# Patient Record
Sex: Female | Born: 1965
Health system: Southern US, Community
[De-identification: ages and names within clinical notes are randomized; demographics above are authoritative.]

## PROBLEM LIST (undated history)

## (undated) DIAGNOSIS — I10 Essential (primary) hypertension: Secondary | ICD-10-CM

## (undated) DIAGNOSIS — R011 Cardiac murmur, unspecified: Secondary | ICD-10-CM

## (undated) DIAGNOSIS — M1990 Unspecified osteoarthritis, unspecified site: Secondary | ICD-10-CM

## (undated) HISTORY — DX: Unspecified osteoarthritis, unspecified site: M19.90

## (undated) HISTORY — DX: Cardiac murmur, unspecified: R01.1

## (undated) HISTORY — DX: Essential (primary) hypertension: I10

---

## 1997-10-13 ENCOUNTER — Ambulatory Visit (HOSPITAL_BASED_OUTPATIENT_CLINIC_OR_DEPARTMENT_OTHER): Admission: RE | Admit: 1997-10-13 | Discharge: 1997-10-13 | Payer: Self-pay | Admitting: Orthopedic Surgery

## 1998-07-15 ENCOUNTER — Encounter: Payer: Self-pay | Admitting: Emergency Medicine

## 1998-07-15 ENCOUNTER — Emergency Department (HOSPITAL_COMMUNITY): Admission: EM | Admit: 1998-07-15 | Discharge: 1998-07-15 | Payer: Self-pay | Admitting: Emergency Medicine

## 1998-07-16 ENCOUNTER — Encounter: Payer: Self-pay | Admitting: Emergency Medicine

## 1998-07-16 ENCOUNTER — Ambulatory Visit (HOSPITAL_COMMUNITY): Admission: RE | Admit: 1998-07-16 | Discharge: 1998-07-16 | Payer: Self-pay | Admitting: *Deleted

## 2000-01-24 ENCOUNTER — Encounter: Payer: Self-pay | Admitting: Internal Medicine

## 2000-01-24 ENCOUNTER — Encounter: Admission: RE | Admit: 2000-01-24 | Discharge: 2000-01-24 | Payer: Self-pay | Admitting: Internal Medicine

## 2003-05-03 HISTORY — PX: CHOLECYSTECTOMY: SHX55

## 2003-09-02 ENCOUNTER — Observation Stay (HOSPITAL_COMMUNITY): Admission: RE | Admit: 2003-09-02 | Discharge: 2003-09-03 | Payer: Self-pay | Admitting: General Surgery

## 2003-09-02 ENCOUNTER — Encounter (INDEPENDENT_AMBULATORY_CARE_PROVIDER_SITE_OTHER): Payer: Self-pay | Admitting: Specialist

## 2007-02-19 ENCOUNTER — Emergency Department (HOSPITAL_COMMUNITY): Admission: EM | Admit: 2007-02-19 | Discharge: 2007-02-19 | Payer: Self-pay | Admitting: Emergency Medicine

## 2008-05-02 HISTORY — PX: DILATION AND CURETTAGE OF UTERUS: SHX78

## 2009-02-25 ENCOUNTER — Ambulatory Visit (HOSPITAL_COMMUNITY): Admission: RE | Admit: 2009-02-25 | Discharge: 2009-02-25 | Payer: Self-pay | Admitting: Obstetrics and Gynecology

## 2009-02-25 ENCOUNTER — Encounter (INDEPENDENT_AMBULATORY_CARE_PROVIDER_SITE_OTHER): Payer: Self-pay | Admitting: Obstetrics and Gynecology

## 2009-02-25 ENCOUNTER — Inpatient Hospital Stay (HOSPITAL_COMMUNITY): Admission: AD | Admit: 2009-02-25 | Discharge: 2009-02-25 | Payer: Self-pay | Admitting: Obstetrics and Gynecology

## 2010-08-05 LAB — CBC
HCT: 33.7 % — ABNORMAL LOW (ref 36.0–46.0)
Hemoglobin: 11.2 g/dL — ABNORMAL LOW (ref 12.0–15.0)
MCHC: 33.1 g/dL (ref 30.0–36.0)
MCV: 91.4 fL (ref 78.0–100.0)
Platelets: 268 10*3/uL (ref 150–400)
RBC: 3.69 MIL/uL — ABNORMAL LOW (ref 3.87–5.11)
RDW: 18.3 % — ABNORMAL HIGH (ref 11.5–15.5)
WBC: 8.5 10*3/uL (ref 4.0–10.5)

## 2010-08-05 LAB — PREGNANCY, URINE: Preg Test, Ur: NEGATIVE

## 2010-09-17 NOTE — Op Note (Signed)
Kristin Alexander, Kristin Alexander                         ACCOUNT NO.:  [id number]   MEDICAL RECORD NO.:  [id number]                   PATIENT TYPE:  AMB   LOCATION:  DAY                                  FACILITY:  [facility]   PHYSICIAN:  Gita Kudo, M.D.              DATE OF BIRTH:  1965-06-06   DATE OF PROCEDURE:  DATE OF DISCHARGE:                                 OPERATIVE REPORT   OPERATION PERFORMED:  Laparoscopic cholecystectomy.   SURGEON:  Gita Kudo, M.D.   ASSISTANT:  Lorne Skeens. Hoxworth, M.D.   ANESTHESIA:  General endotracheal anesthesia.   PREOPERATIVE DIAGNOSIS:  Gallstones.   POSTOPERATIVE DIAGNOSIS:  Gallstones, subacutely inflamed with adhesions, a  tiny cystic duct, unable to be cannulated.   CLINICAL SUMMARY:  A 45 year old female seen with acute abdominal pain.  Several episodes and worsening recently.  Comes in for elective surgery.   Her gallbladder ultrasound shows stones, and her liver function studies are  normal.   FINDINGS:  There are multiple adhesions to the gallbladder, which itself was  thickened and inflamed.  The cystic duct was tiny, and I could not cannulate  it to do a cholangiogram.   OPERATION/PROCEDURE:  Under satisfactory general endotracheal anesthesia,  having received 1.0 gm Ancef preop, the patient's abdomen was prepped and  draped in a standard fashion.  A total of 30 cc of 0.5% Marcaine was  infiltrated at the four skin incision sites before making that.  A  transverse incision made above the umbilicus and carried down to the  midline.  The patient's was quite deep, but we visualized the midline and  opened it into the peritoneum.  This was controlled with a figure-of-eight 0  Vicryl suture.  An Hasson operating port was inserted and secured.  Good CO2  pneumoperitoneum established and camera placed.  Under direct vision, two #5  ports placed laterally and a second #10 medially.  Then with graspers in the  lateral port giving  good exposure, operating through the medial port, I took  down the adhesions to the gallbladder with care, using coagulating scissors  for dissection.  Then the cystic duct and artery were each circumferentially  dissected.  When certain of the anatomy, multiple clips placed on the artery  and a single clip on the duct near the gallbladder.  An incision was made in  the duct, and it was so tiny that I thought that it would be impossible to  cannulate it, and after several attempts, could not do so.  Then the duct  was controlled with three clips and divided.  The gallbladder then removed  from below upward using a coagulating current for dissection and hemostasis.  The liver bed was dry.  A small hole was made in the gallbladder, and it was  then suctioned free of contents.  There was no gross spillage of stones.  The gallbladder was then placed  in an EndoCatch bag and secured.  The  abdomen was lavaged with a remainder of that liter of saline and suctioned  dry.  Then the camera was moved to the upper port and a large grasper used  to extract the gallbladder, in the bag intact without spillage or problem  through the umbilicus.  A second liter was again used to irrigate the upper  abdomen, and these returns were clear.  Then the CO2 was released.  The  ports removed under vision.  The midline was close with the previous suture  as well as a second interrupted #0 Vicryl suture.  Then the upper medial  port site was extended somewhat, and its fascial defect closed with a single  figure-of-eight 0 Vicryl.  The subcu was lavaged with saline, approximately  with 4-0 Vicryl, and the skin approximated with 4-0 Vicryl and Steri-Strips.  Sterile dressings were applied, and the patient went to the recovery room  from the operating room in good condition without complication.                                               Gita Kudo, M.D.    MRL/MEDQ  D:  09/02/2003  T:  09/02/2003  Job:   [id number]   cc:   Aida Puffer, MD  [facility]  [address]  [place], [place] [address]

## 2012-04-03 ENCOUNTER — Other Ambulatory Visit: Payer: Self-pay | Admitting: Obstetrics and Gynecology

## 2013-04-09 ENCOUNTER — Other Ambulatory Visit: Payer: Self-pay | Admitting: Obstetrics and Gynecology

## 2013-04-17 ENCOUNTER — Other Ambulatory Visit: Payer: Self-pay | Admitting: Obstetrics and Gynecology

## 2013-04-17 DIAGNOSIS — R928 Other abnormal and inconclusive findings on diagnostic imaging of breast: Secondary | ICD-10-CM

## 2013-04-26 ENCOUNTER — Ambulatory Visit
Admission: RE | Admit: 2013-04-26 | Discharge: 2013-04-26 | Disposition: A | Discharge status: Home / Self Care | Payer: No Typology Code available for payment source | Source: Ambulatory Visit | Attending: Obstetrics and Gynecology | Admitting: Obstetrics and Gynecology

## 2013-04-26 DIAGNOSIS — R928 Other abnormal and inconclusive findings on diagnostic imaging of breast: Secondary | ICD-10-CM

## 2014-08-07 ENCOUNTER — Other Ambulatory Visit: Payer: Self-pay | Admitting: Obstetrics and Gynecology

## 2014-08-08 LAB — CYTOLOGY - PAP

## 2015-08-13 ENCOUNTER — Encounter: Payer: Self-pay | Admitting: Physical Medicine & Rehabilitation

## 2015-08-13 ENCOUNTER — Encounter
Payer: BLUE CROSS/BLUE SHIELD | Attending: Physical Medicine & Rehabilitation | Admitting: Physical Medicine & Rehabilitation

## 2015-08-13 VITALS — BP 155/95 | HR 90 | Resp 14

## 2015-08-13 DIAGNOSIS — R269 Unspecified abnormalities of gait and mobility: Secondary | ICD-10-CM

## 2015-08-13 DIAGNOSIS — M1990 Unspecified osteoarthritis, unspecified site: Secondary | ICD-10-CM

## 2015-08-13 DIAGNOSIS — G894 Chronic pain syndrome: Secondary | ICD-10-CM | POA: Diagnosis not present

## 2015-08-13 DIAGNOSIS — Z9049 Acquired absence of other specified parts of digestive tract: Secondary | ICD-10-CM | POA: Diagnosis not present

## 2015-08-13 DIAGNOSIS — M170 Bilateral primary osteoarthritis of knee: Secondary | ICD-10-CM

## 2015-08-13 DIAGNOSIS — M25561 Pain in right knee: Secondary | ICD-10-CM | POA: Diagnosis present

## 2015-08-13 DIAGNOSIS — M25562 Pain in left knee: Secondary | ICD-10-CM | POA: Diagnosis present

## 2015-08-13 DIAGNOSIS — Z0000 Encounter for general adult medical examination without abnormal findings: Secondary | ICD-10-CM

## 2015-08-13 DIAGNOSIS — Z79899 Other long term (current) drug therapy: Secondary | ICD-10-CM | POA: Diagnosis not present

## 2015-08-13 DIAGNOSIS — Z5181 Encounter for therapeutic drug level monitoring: Secondary | ICD-10-CM | POA: Diagnosis not present

## 2015-08-13 HISTORY — DX: Unspecified osteoarthritis, unspecified site: M19.90

## 2015-08-13 MED ORDER — TRAMADOL HCL 50 MG PO TABS: 50.0000 mg | ORAL_TABLET | Freq: Three times a day (TID) | ORAL | Status: DC | PRN

## 2015-08-13 NOTE — Progress Notes (Signed)
Subjective:    Patient ID: Kristin Alexander, female    DOB: 04/05/1966, 50 y.o.   MRN: [id number]  HPI  50 y/o female with pmh of arthritis in knees and hands, morbid obesity presents for R>L knee pain.  The pain started ~2000, she denies trauma.  It has been getting progressively worse since January 2017.  She had cortisone and hyaluronic acid injections, which helped on the left knee, but not the right.  She is also on meloxicam (for 2 months), which is helping a little bit.  Heat improves the pain.  Cold, walking, standing, exacerbate the pain.  Denies associated numbness/tingling weakness.  Sharp, stabbing, achy pain.  It can radiate to her foot.  She has occasional back pain.  Knee pain is constant.  10/10 severity today.  The plan is for her to have a TKR, but is not a good surgical candidate at present.   She states she had xrays performed (not available for review), which showed lateral OA.  She is using a brace for comfort.   She ambulates with a cane.  She has not seen a PCP due to financial restrictions for years, but is planning on seeing one next week.   Pain Inventory Average Pain 10 Pain Right Now 10 My pain is sharp, burning, stabbing and aching  In the last 24 hours, has pain interfered with the following? General activity 10 Relation with others 10 Enjoyment of life 10 What TIME of day is your pain at its worst? morning, daytime, evening, night Sleep (in general) Fair  Pain is worse with: walking, sitting and standing Pain improves with: rest and heat/ice Relief from Meds: NA  Mobility walk with assistance use a cane how many minutes can you walk? 7 steps  do you drive?  yes  Function employed # of hrs/week 40  Neuro/Psych trouble walking  Prior Studies Any changes since last visit?  no  Physicians involved in your care Orthopedist Dr. Eulah PontMurphy   Family History  Problem Relation Age of Onset  . Hypertension Mother   . Diabetes Mother   . COPD Mother     . Heart disease Mother   . Diabetes Father   . Stroke Father   . Heart disease Father   . Heart attack Sister   . Lupus Sister    Social History   Social History  . Marital Status: Single    Spouse Name: N/A  . Number of Children: N/A  . Years of Education: N/A   Social History Main Topics  . Smoking status: Never Smoker   . Smokeless tobacco: None  . Alcohol Use: None  . Drug Use: None  . Sexual Activity: Not Asked   Other Topics Concern  . None   Social History Narrative  . None   Past Surgical History  Procedure Laterality Date  . Cholecystectomy  2005  . Dilation and curettage of uterus  2010   Past Medical History  Diagnosis Date  . Arthritis    BP 155/95 mmHg  Pulse 90  Resp 14  SpO2 98%  LMP  (Within Months)  Opioid Risk Score:   Fall Risk Score:  `1  Depression screen PHQ 2/9  Depression screen PHQ 2/9 08/13/2015  Decreased Interest 3  Down, Depressed, Hopeless 2  PHQ - 2 Score 5  Altered sleeping 2  Tired, decreased energy 3  Change in appetite 3  Feeling bad or failure about yourself  2  Trouble concentrating 1  Moving slowly or fidgety/restless 3  Suicidal thoughts 1  PHQ-9 Score 20  Difficult doing work/chores Very difficult    Current outpatient prescriptions:  .  acetaminophen (TYLENOL) 325 MG tablet, Take 650 mg by mouth every 6 (six) hours as needed., Disp: , Rfl:  .  meloxicam (MOBIC) 15 MG tablet, Take 15 mg by mouth daily., Disp: , Rfl:  .  norethindrone-ethinyl estradiol (JUNEL FE,GILDESS FE,LOESTRIN FE) 1-20 MG-MCG tablet, Take 1 tablet by mouth daily., Disp: , Rfl:  .  omeprazole (PRILOSEC) 20 MG capsule, Take 20 mg by mouth daily., Disp: , Rfl:    Review of Systems  Cardiovascular:       Limb swelling   Musculoskeletal: Positive for gait problem.  All other systems reviewed and are negative.     Objective:   Physical Exam HENT: Normocephalic, Atraumatic Eyes: EOMI, Conj WNL Cardio: S1, S2 normal, RRR Pulm: B/l  clear to auscultation.  Effort normal Abd: Obese. Soft, non-distended, non-tender, BS+ MSK:  Gait Antalgic.   TTP lateral >medial knee.   Negative anterior/posterior drawer sign  Valgus deformity b/l knees  Neuro:  Sensation intact to light touch in all LE dermatomes  Reflexes 1+ throughout (limited by pain and body habitus)  Strength  4/5 in all RLE myotimes (pain inhibition)    5/5 in all LLE myotomes Skin: Warm and Dry    Assessment & Plan:  50 y/o female with pmh of arthritis in knees and hands, morbid obesity presents for R>L knee pain.  1. OA b/l knees  She states xrays revealed lateral compartment OA b/l  Left knee improved with injections  Right knee end stage OA, not a surgical candidate due to BMI  She does not have access to a pool  Referral for PT for RLE exercises and quad strengthening, and TENS unit  Cont Meloxicam per Ortho  Will refer to orthotist for knee off-loading brace and/or heel wedge  Will consider Voltaren gel in future  Will order tramadol 50 q8 PRN  2. Abnormality of gait  No falls  Cont cane for safety  3. Morbid obesity  Referral to PT  Encouraged diet compliance and weight loss  4. General health  Follow up with PCP regarding lab work, especially Cr, being on Meloxicam  5. Chronic pain syndrome  See above  RTC 6 weeks.

## 2015-08-19 LAB — TOXASSURE SELECT,+ANTIDEPR,UR: PDF: 0

## 2015-08-21 NOTE — Progress Notes (Signed)
Urine drug screen for this encounter is consistent for having no prescribed narcotic medication

## 2015-09-23 ENCOUNTER — Ambulatory Visit: Payer: BLUE CROSS/BLUE SHIELD | Admitting: Physical Medicine & Rehabilitation

## 2015-09-23 ENCOUNTER — Telehealth: Payer: Self-pay | Admitting: *Deleted

## 2015-09-23 NOTE — Telephone Encounter (Signed)
Pt's pharmacy called asking for tramadol refill on behalf of patient.  Patient has upcoming appointment on 05/31. Last Rx for this medication was written 08/13/2015 during pt's last visit (tramadol 50 mg #120, sig: take 1 tablet by mouth every 8 hours).  Patient was told to schedule next clinic visit 6 weeks out, hence the 05/31 appt date.  Please advise on refill

## 2015-09-24 ENCOUNTER — Other Ambulatory Visit: Payer: Self-pay | Admitting: *Deleted

## 2015-09-24 MED ORDER — TRAMADOL HCL 50 MG PO TABS: 50.0000 mg | ORAL_TABLET | Freq: Three times a day (TID) | ORAL | Status: DC | PRN

## 2015-09-25 ENCOUNTER — Ambulatory Visit: Payer: No Typology Code available for payment source | Admitting: Physical Medicine & Rehabilitation

## 2015-09-25 ENCOUNTER — Other Ambulatory Visit: Payer: Self-pay

## 2015-09-25 MED ORDER — TRAMADOL HCL 50 MG PO TABS: 50.0000 mg | ORAL_TABLET | Freq: Three times a day (TID) | ORAL | Status: DC | PRN

## 2015-09-25 NOTE — Telephone Encounter (Signed)
Pt states that the Tramadol was sent to the wrong pharmacy. Tramadol [facility] called into [place].

## 2015-09-30 ENCOUNTER — Encounter: Payer: Self-pay | Admitting: Physical Medicine & Rehabilitation

## 2015-09-30 ENCOUNTER — Encounter
Payer: BLUE CROSS/BLUE SHIELD | Attending: Physical Medicine & Rehabilitation | Admitting: Physical Medicine & Rehabilitation

## 2015-09-30 VITALS — BP 165/94 | HR 72 | Resp 16

## 2015-09-30 DIAGNOSIS — I10 Essential (primary) hypertension: Secondary | ICD-10-CM

## 2015-09-30 DIAGNOSIS — Z9049 Acquired absence of other specified parts of digestive tract: Secondary | ICD-10-CM | POA: Insufficient documentation

## 2015-09-30 DIAGNOSIS — M170 Bilateral primary osteoarthritis of knee: Secondary | ICD-10-CM

## 2015-09-30 DIAGNOSIS — R269 Unspecified abnormalities of gait and mobility: Secondary | ICD-10-CM | POA: Diagnosis not present

## 2015-09-30 DIAGNOSIS — G894 Chronic pain syndrome: Secondary | ICD-10-CM | POA: Diagnosis not present

## 2015-09-30 DIAGNOSIS — M25561 Pain in right knee: Secondary | ICD-10-CM | POA: Diagnosis present

## 2015-09-30 DIAGNOSIS — M25562 Pain in left knee: Secondary | ICD-10-CM | POA: Diagnosis present

## 2015-09-30 MED ORDER — TRAMADOL HCL 50 MG PO TABS: 50.0000 mg | ORAL_TABLET | Freq: Three times a day (TID) | ORAL | Status: DC | PRN

## 2015-09-30 MED ORDER — DICLOFENAC SODIUM 1 % TD GEL: 2.0000 g | Freq: Four times a day (QID) | TRANSDERMAL | Status: DC

## 2015-09-30 NOTE — Progress Notes (Signed)
Subjective:    Patient ID: Kristin Alexander, female    DOB: Aug 04, 1965, 50 y.o.   MRN: [id number]  HPI  50 y/o female with pmh of arthritis in knees and hands, morbid obesity presents for follow up of R>L knee pain.  The pain started ~2000, she denies trauma. She had cortisone and hyaluronic acid injections, which helped on the left knee, but not the right.   Heat improves the pain.  Cold, walking, standing, exacerbate the pain.  Denies associated numbness/tingling weakness.  Sharp, stabbing, achy pain.  It can radiate to her foot.  She has occasional back pain.  Knee pain is constant.  The plan is for her to have a TKR, but is not a good surgical candidate at present.  She states she had xrays performed (not available for review), which showed lateral OA.    Last clinic visit 08/13/15. She ambulates with a cane for community ambulation and at night.  Pain is getting better since last visit.  She continues to take meloxicam, which is helping. 5/10 severity today.  She is using a brace for comfort.  Since last visit, she was able to see a PCP.   On last visit, she was referred to PT, which has been "wonderful".  Her ROM has significantly improved.  The tramadol has significantly improved, she was taking q8, now BID.  Her gait and endurance has significantly improved.  She has not tried TENS unit. She has not received an off-loading brace.  Denies falls. She continues to try to lose weight.   Pt continues to work.   Pain Inventory Average Pain 5 Pain Right Now 5 My pain is dull and aching  In the last 24 hours, has pain interfered with the following? General activity 5 Relation with others 5 Enjoyment of life 5 What TIME of day is your pain at its worst? morning Sleep (in general) Fair  Pain is worse with: walking and standing Pain improves with: heat/ice and therapy/exercise Relief from Meds: NA  Mobility use a cane how many minutes can you walk? 5-8 min do you drive?   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  no  Physicians involved in your care Orthopedist Dr. Eulah Pont   Family History  Problem Relation Age of Onset  . Hypertension Mother   . Diabetes Mother   . COPD Mother   . Heart disease Mother   . Diabetes Father   . Stroke Father   . Heart disease Father   . Heart attack Sister   . Lupus Sister    Social History   Social History  . Marital Status: Single    Spouse Name: N/A  . Number of Children: N/A  . Years of Education: N/A   Social History Main Topics  . Smoking status: Never Smoker   . Smokeless tobacco: None  . Alcohol Use: None  . Drug Use: None  . Sexual Activity: Not Asked   Other Topics Concern  . None   Social History Narrative   Past Surgical History  Procedure Laterality Date  . Cholecystectomy  2005  . Dilation and curettage of uterus  2010   Past Medical History  Diagnosis Date  . Arthritis   . OA (osteoarthritis) 08/13/2015   BP 165/94 mmHg  Pulse 72  Resp 16  SpO2 100%  Opioid Risk Score:   Fall Risk Score:  `  1  Depression screen PHQ 2/9  [facility] 09/30/2015 08/13/2015  Decreased Interest 0 3  Down, Depressed, Hopeless 0 2  PHQ - 2 Score 0 5  Altered sleeping - 2  Tired, decreased energy - 3  Change in appetite - 3  Feeling bad or failure about yourself  - 2  Trouble concentrating - 1  Moving slowly or fidgety/restless - 3  Suicidal thoughts - 1  PHQ-9 Score - 20  Difficult doing work/chores - Very difficult    Current outpatient prescriptions:  .  acetaminophen (TYLENOL) 325 MG tablet, Take 650 mg by mouth every 6 (six) hours as needed., Disp: , Rfl:  .  meloxicam (MOBIC) 15 MG tablet, Take 15 mg by mouth daily., Disp: , Rfl:  .  norethindrone-ethinyl estradiol (JUNEL FE,GILDESS FE,LOESTRIN FE) 1-20 MG-MCG tablet, Take 1 tablet by mouth daily., Disp: ,  Rfl:  .  omeprazole (PRILOSEC) 20 MG capsule, Take 20 mg by mouth daily., Disp: , Rfl:  .  traMADol (ULTRAM) 50 MG tablet, Take 1 tablet (50 mg total) by mouth every 8 (eight) hours as needed., Disp: 15 tablet, Rfl: 0   Review of Systems  Cardiovascular:       Limb swelling   Musculoskeletal: Positive for arthralgias and gait problem. Negative for myalgias and back pain.  Neurological: Negative for weakness and numbness.  All other systems reviewed and are negative.     Objective:   Physical Exam HENT: Normocephalic, Atraumatic Eyes: EOMI, Conj WNL Cardio: S1, S2 normal, RRR Pulm: B/l clear to auscultation.  Effort normal Abd: Obese. Soft, non-distended, non-tender, BS+ MSK:  Gait Antalgic.   No TTP bilateral knees.   Negative anterior/posterior drawer sign  Valgus deformity b/l knees  Neuro:  Sensation intact to light touch in all LE dermatomes  Reflexes 1+ throughout (limited by pain and body habitus)  Strength  4+/5 in all RLE myotimes (pain inhibition)    5/5 in all LLE myotomes Skin: Warm and Dry    Assessment & Plan:  50 y/o female with pmh of arthritis in knees and hands, morbid obesity presents for follow up of R>L knee pain.  1. OA b/l knees  She states xrays revealed lateral compartment OA b/l  Left knee improved with injections  Right knee end stage OA, not a surgical candidate due to BMI  She does not have access to a pool  Cont Meloxicam per Ortho  Will follow up on referral to orthotist for knee off-loading brace  Cont PT for RLE exercises and quad strengthening, and TENS unit  Will order Voltaren gel  Cont tramadol 50 q8 PRN (pt has reduced dose, sometimes only needing it BID at present)  2. Abnormality of gait  No falls  Cont cane for safety  3. Morbid obesity  Cont PT  Encouraged diet compliance and weight loss  4. General health  Follow up with PCP for lab work, especially Cr, being on Meloxicam and BP  5. Chronic pain syndrome  See above  6.  HTN  See #4  RTC 6 weeks.

## 2015-09-30 NOTE — Patient Instructions (Addendum)
Follow up on pool therapy programs  Follow up with PCP regarding routine labs, please bring to next visit  Follow up with PT regarding TENs unit  Follow up with Orthotist for offloading brace

## 2015-09-30 NOTE — Addendum Note (Signed)
Addended by: Maryla MorrowPATEL, Darcelle Herrada A on: 09/30/2015 10:14 AM   Modules accepted: Orders

## 2015-10-27 LAB — BASIC METABOLIC PANEL
Creatinine: 0.6 (ref 0.5–1.1)
Glucose: 85
Potassium: 4.1 (ref 3.4–5.3)
SODIUM: 139 (ref 137–147)

## 2015-10-27 LAB — LIPID PANEL
CHOLESTEROL: 207 — AB (ref 0–200)
HDL: 46 (ref 35–70)
LDL CALC: 131
Triglycerides: 149 (ref 40–160)

## 2015-10-27 LAB — CBC AND DIFFERENTIAL
HCT: 41 (ref 36–46)
HEMOGLOBIN: 13.4 (ref 12.0–16.0)
Platelets: 266 (ref 150–399)
WBC: 10.7

## 2015-10-27 LAB — HEPATIC FUNCTION PANEL
ALT: 13 (ref 7–35)
AST: 19 (ref 13–35)
Alkaline Phosphatase: 87 (ref 25–125)
BILIRUBIN, TOTAL: 0.3

## 2015-10-27 LAB — TSH: TSH: 3.34 (ref 0.41–5.90)

## 2015-11-11 ENCOUNTER — Encounter: Payer: BLUE CROSS/BLUE SHIELD | Admitting: Physical Medicine & Rehabilitation

## 2015-11-12 ENCOUNTER — Encounter
Payer: BLUE CROSS/BLUE SHIELD | Attending: Physical Medicine & Rehabilitation | Admitting: Physical Medicine & Rehabilitation

## 2015-11-12 ENCOUNTER — Encounter: Payer: Self-pay | Admitting: Physical Medicine & Rehabilitation

## 2015-11-12 VITALS — BP 143/96 | HR 109 | Resp 18

## 2015-11-12 DIAGNOSIS — M170 Bilateral primary osteoarthritis of knee: Secondary | ICD-10-CM

## 2015-11-12 DIAGNOSIS — M25561 Pain in right knee: Secondary | ICD-10-CM | POA: Diagnosis present

## 2015-11-12 DIAGNOSIS — M25562 Pain in left knee: Secondary | ICD-10-CM | POA: Diagnosis present

## 2015-11-12 DIAGNOSIS — R269 Unspecified abnormalities of gait and mobility: Secondary | ICD-10-CM | POA: Diagnosis not present

## 2015-11-12 DIAGNOSIS — Z9049 Acquired absence of other specified parts of digestive tract: Secondary | ICD-10-CM | POA: Diagnosis not present

## 2015-11-12 DIAGNOSIS — G894 Chronic pain syndrome: Secondary | ICD-10-CM | POA: Diagnosis not present

## 2015-11-12 MED ORDER — TRAMADOL HCL 50 MG PO TABS: 50.0000 mg | ORAL_TABLET | Freq: Four times a day (QID) | ORAL | Status: DC | PRN

## 2015-11-12 MED ORDER — LIDOCAINE 5 % EX PTCH: 1.0000 | MEDICATED_PATCH | CUTANEOUS | Status: DC

## 2015-11-12 NOTE — Progress Notes (Deleted)
   Subjective:    Patient ID: Kristin LamingNina R Alexander, female    DOB: 02/22/1966, 50 y.o.   MRN: [id number]  HPI  Pain Inventory Average Pain 3 Pain Right Now 3 My pain is dull  In the last 24 hours, has pain interfered with the following? General activity 5 Relation with others 4 Enjoyment of life 4 What TIME of day is your pain at its worst? evening Sleep (in general) Fair  Pain is worse with: walking Pain improves with: rest, heat/ice, therapy/exercise and medication Relief from Meds: fair  Mobility use a cane how many minutes can you walk? 15 ability to climb steps?  yes do you drive?  yes  Function employed # of hrs/week 40  Neuro/Psych trouble walking  Prior Studies Any changes since last visit?  no  Physicians involved in your care Any changes since last visit?  no   Family History  Problem Relation Age of Onset  . Hypertension Mother   . Diabetes Mother   . COPD Mother   . Heart disease Mother   . Diabetes Father   . Stroke Father   . Heart disease Father   . Heart attack Sister   . Lupus Sister    Social History   Social History  . Marital Status: Single    Spouse Name: N/A  . Number of Children: N/A  . Years of Education: N/A   Social History Main Topics  . Smoking status: Never Smoker   . Smokeless tobacco: None  . Alcohol Use: None  . Drug Use: None  . Sexual Activity: Not Asked   Other Topics Concern  . None   Social History Narrative   Past Surgical History  Procedure Laterality Date  . Cholecystectomy  2005  . Dilation and curettage of uterus  2010   Past Medical History  Diagnosis Date  . Arthritis   . OA (osteoarthritis) 08/13/2015   BP 143/96 mmHg  Pulse 109  Resp 18  SpO2 97%  Opioid Risk Score:   Fall Risk Score:  `1  Depression screen PHQ 2/9  [facility] 09/30/2015 08/13/2015  Decreased Interest 0 3  Down, Depressed, Hopeless 0 2  PHQ - 2 Score 0 5  Altered sleeping - 2  Tired, decreased energy -  3  Change in appetite - 3  Feeling bad or failure about yourself  - 2  Trouble concentrating - 1  Moving slowly or fidgety/restless - 3  Suicidal thoughts - 1  PHQ-9 Score - 20  Difficult doing work/chores - Very difficult     Review of Systems  Musculoskeletal: Positive for gait problem.  All other systems reviewed and are negative.      Objective:   Physical Exam        Assessment & Plan:

## 2015-11-12 NOTE — Progress Notes (Addendum)
Subjective:    Patient ID: Kristin Alexander, female    DOB: 06-07-1965, 50 y.o.   MRN: [id number]  HPI  50 y/o female with pmh of arthritis in knees and hands, morbid obesity presents for follow up of R>L knee pain.  The pain started ~2000, she denies trauma. She had cortisone and hyaluronic acid injections, which helped on the left knee, but not the right.   Heat improves the pain.  Cold, walking, standing, exacerbate the pain.  Denies associated numbness/tingling weakness.  Sharp, stabbing, achy pain.  It can radiate to her foot.  She has occasional back pain.  Knee pain is constant.  The plan is for her to have a TKR, but is not a good surgical candidate at present.  She states she had xrays performed (not available for review), which showed lateral OA.    Last clinic visit 09/30/15. She continues to work and trying to lose weight.  Today pain 2-3/10. She continues to take Meloxicam.  She has not been to the pool.  She never followed up regarding the knee off-loading brace. She completed PT and his now doing HEP.  She never had her voltaren gel filled.  The tens unit helps.  She continues to take tramadol 1-2 BID PRN (down from before). She has reduced the amount she has had to rely on her cane.  She saw her PCP and her Cr. Was WNL.    Pain Inventory Average Pain 3 Pain Right Now 3 My pain is dull  In the last 24 hours, has pain interfered with the following? General activity 5 Relation with others 4 Enjoyment of life 4 What TIME of day is your pain at its worst? evening Sleep (in general) Fair  Pain is worse with: walking Pain improves with: rest, heat/ice, therapy/exercise and medication Relief from Meds: fair  Mobility use a cane how many minutes can you walk? 15 ability to climb steps?  yes do you drive?  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  no  Physicians involved in  your care Any changes since last visit?  no Orthopedist Dr. Eulah Pont   Family History  Problem Relation Age of Onset  . Hypertension Mother   . Diabetes Mother   . COPD Mother   . Heart disease Mother   . Diabetes Father   . Stroke Father   . Heart disease Father   . Heart attack Sister   . Lupus Sister    Social History   Social History  . Marital Status: Single    Spouse Name: N/A  . Number of Children: N/A  . Years of Education: N/A   Social History Main Topics  . Smoking status: Never Smoker   . Smokeless tobacco: None  . Alcohol Use: None  . Drug Use: None  . Sexual Activity: Not Asked   Other Topics Concern  . None   Social History Narrative   Past Surgical History  Procedure Laterality Date  . Cholecystectomy  2005  . Dilation and curettage of uterus  2010   Past Medical History  Diagnosis Date  . Arthritis   . OA (osteoarthritis) 08/13/2015   BP 143/96 mmHg  Pulse 109  Resp 18  SpO2 97%  Opioid Risk Score:   Fall Risk Score:  `1  Depression screen PHQ 2/9  Depression screen [facility] 2/9 09/30/2015 08/13/2015  Decreased Interest 0  3  Down, Depressed, Hopeless 0 2  PHQ - 2 Score 0 5  Altered sleeping - 2  Tired, decreased energy - 3  Change in appetite - 3  Feeling bad or failure about yourself  - 2  Trouble concentrating - 1  Moving slowly or fidgety/restless - 3  Suicidal thoughts - 1  PHQ-9 Score - 20  Difficult doing work/chores - Very difficult    Current outpatient prescriptions:  .  acetaminophen (TYLENOL) 325 MG tablet, Take 650 mg by mouth every 6 (six) hours as needed., Disp: , Rfl:  .  diclofenac sodium (VOLTAREN) 1 % GEL, Apply 2 g topically 4 (four) times daily., Disp: 1 Tube, Rfl: 1 .  meloxicam (MOBIC) 15 MG tablet, Take 15 mg by mouth daily., Disp: , Rfl:  .  norethindrone-ethinyl estradiol (JUNEL FE,GILDESS FE,LOESTRIN FE) 1-20 MG-MCG tablet, Take 1 tablet by mouth daily., Disp: , Rfl:  .  omeprazole (PRILOSEC) 20 MG capsule,  Take 20 mg by mouth daily., Disp: , Rfl:  .  traMADol (ULTRAM) 50 MG tablet, Take 1 tablet (50 mg total) by mouth every 8 (eight) hours as needed., Disp: 135 tablet, Rfl: 0  Review of Systems  Cardiovascular:       Limb swelling   Musculoskeletal: Positive for arthralgias and gait problem. Negative for myalgias and back pain.  Neurological: Negative for weakness and numbness.  All other systems reviewed and are negative.     Objective:   Physical Exam HENT: Normocephalic, Atraumatic Eyes: EOMI, Conj WNL Cardio: S1, S2 normal, RRR Pulm: B/l clear to auscultation.  Effort normal Abd: Obese. Soft, non-distended, non-tender, BS+ MSK:  Gait Antalgic.   No TTP bilateral knees.   Negative anterior/posterior drawer sign  Valgus deformity b/l knees  Neuro:  Sensation intact to light touch in all LE dermatomes  Reflexes 1+ throughout (limited by pain and body habitus)  Strength  4+-5/5 in all RLE myotomes (pain inhibition)    5/5 in all LLE myotomes Skin: Warm and Dry    Assessment & Plan:  50 y/o female with pmh of arthritis in knees and hands, morbid obesity presents for follow up of R>L knee pain.  1. OA b/l knees  She states xrays revealed lateral compartment OA b/l  Left knee improved with injections  Right knee end stage OA, not a surgical candidate due to BMI  She will inquire about pool therapy  Cont Meloxicam per Ortho  Cont HEP, RLE exercises and quad strengthening, and TENS unit (benefit with home TENs) (completed PT)  Will order lidoderm patch  Cont tramadol 50 BID PRN   Will refer for [employer]  2. Abnormality of gait  No falls  Cont cane for safety  3. Morbid obesity  Cont PT  Encouraged diet compliance and weight loss  Pt's PCP managing at this time.  Will consider referral to dietitian in future if necessary  4. General health  Cont to follow with PCP  5. Chronic pain syndrome  See above  RTC 6 weeks.

## 2016-02-12 ENCOUNTER — Ambulatory Visit: Payer: BLUE CROSS/BLUE SHIELD | Admitting: Physical Medicine & Rehabilitation

## 2016-03-02 ENCOUNTER — Encounter: Payer: BLUE CROSS/BLUE SHIELD | Admitting: Physical Medicine & Rehabilitation

## 2016-03-30 ENCOUNTER — Encounter: Payer: BLUE CROSS/BLUE SHIELD | Admitting: Physical Medicine & Rehabilitation

## 2016-04-07 ENCOUNTER — Other Ambulatory Visit: Payer: Self-pay | Admitting: *Deleted

## 2016-04-07 ENCOUNTER — Telehealth: Payer: Self-pay | Admitting: Physical Medicine & Rehabilitation

## 2016-04-07 MED ORDER — TRAMADOL HCL 50 MG PO TABS: 50.0000 mg | ORAL_TABLET | Freq: Four times a day (QID) | ORAL | 0 refills | Status: DC | PRN

## 2016-04-07 NOTE — Telephone Encounter (Signed)
Patient left voicemail needs med refill before the 21st

## 2016-04-07 NOTE — Addendum Note (Signed)
Addended by: Angela NevinWESSLING, Derry Kassel D on: 04/07/2016 12:09 PM   Modules accepted: Orders

## 2016-04-07 NOTE — Telephone Encounter (Signed)
Contacted pt at work phone number, asked her co-worker to please call us back

## 2016-04-21 ENCOUNTER — Encounter
Payer: BLUE CROSS/BLUE SHIELD | Attending: Physical Medicine & Rehabilitation | Admitting: Physical Medicine & Rehabilitation

## 2016-04-21 ENCOUNTER — Encounter: Payer: Self-pay | Admitting: Physical Medicine & Rehabilitation

## 2016-04-21 VITALS — BP 167/99 | HR 112 | Resp 14

## 2016-04-21 DIAGNOSIS — M19041 Primary osteoarthritis, right hand: Secondary | ICD-10-CM | POA: Diagnosis not present

## 2016-04-21 DIAGNOSIS — Z823 Family history of stroke: Secondary | ICD-10-CM | POA: Diagnosis not present

## 2016-04-21 DIAGNOSIS — G894 Chronic pain syndrome: Secondary | ICD-10-CM | POA: Insufficient documentation

## 2016-04-21 DIAGNOSIS — I10 Essential (primary) hypertension: Secondary | ICD-10-CM

## 2016-04-21 DIAGNOSIS — M170 Bilateral primary osteoarthritis of knee: Secondary | ICD-10-CM | POA: Diagnosis not present

## 2016-04-21 DIAGNOSIS — R269 Unspecified abnormalities of gait and mobility: Secondary | ICD-10-CM | POA: Diagnosis not present

## 2016-04-21 DIAGNOSIS — Z9049 Acquired absence of other specified parts of digestive tract: Secondary | ICD-10-CM | POA: Insufficient documentation

## 2016-04-21 DIAGNOSIS — Z833 Family history of diabetes mellitus: Secondary | ICD-10-CM | POA: Diagnosis not present

## 2016-04-21 DIAGNOSIS — M19042 Primary osteoarthritis, left hand: Secondary | ICD-10-CM | POA: Insufficient documentation

## 2016-04-21 DIAGNOSIS — Z8249 Family history of ischemic heart disease and other diseases of the circulatory system: Secondary | ICD-10-CM | POA: Diagnosis not present

## 2016-04-21 MED ORDER — DICLOFENAC SODIUM 1 % TD GEL: 2.0000 g | Freq: Four times a day (QID) | TRANSDERMAL | 1 refills | Status: DC

## 2016-04-21 NOTE — Progress Notes (Signed)
Subjective:    Patient ID: Kristin Alexander, female    DOB: 03/26/1966, 50 y.o.   MRN: [id number]  HPI  50 y/o female with pmh of arthritis in knees and hands, morbid obesity presents for follow up of R>L knee pain.  The pain started ~2000, she denies trauma. She had cortisone and hyaluronic acid injections, which helped on the left knee, but not the right.   Heat improves the pain.  Cold, walking, standing, exacerbate the pain.  Denies associated numbness/tingling weakness.  Sharp, stabbing, achy pain.  It can radiate to her foot.  She has occasional back pain.  Knee pain is constant.  The plan is for her to have a TKR, but is not a good surgical candidate at present.  She states she had xrays performed (not available for review), which showed lateral OA.     Last clinic visit 11/12/15. Since last visit, she states she has not had the money for pool therapy.  She stopped taking Mobic, which was prescribed by Ortho.  She continues HEP.  She take takes IBU as needed.  She really likes her TENS unit.  She states she never received lidoderm patch.  She takes tramadol BID.  She uses a cane as needed. She states she has not made much progress with weight loss since the holidays, but plans to start trying again afterward.   Since last visit she tripped over bookshelf at work.  Overall, she states she is managing.    Pain Inventory Average Pain 5 Pain Right Now 5 My pain is dull and aching  In the last 24 hours, has pain interfered with the following? General activity 5 Relation with others 5 Enjoyment of life 5 What TIME of day is your pain at its worst? morning, evening Sleep (in general) Fair  Pain is worse with: walking, bending and standing Pain improves with: rest, heat/ice, medication and TENS Relief from Meds: 6  Mobility walk with assistance use a cane how many minutes can you walk? 15 ability to climb steps?  yes do you drive?  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  no  Physicians involved in your care Any changes since last visit?  no Orthopedist Dr. Eulah PontMurphy   Family History  Problem Relation Age of Onset  . Hypertension Mother   . Diabetes Mother   . COPD Mother   . Heart disease Mother   . Diabetes Father   . Stroke Father   . Heart disease Father   . Heart attack Sister   . Lupus Sister    Social History   Social History  . Marital status: Single    Spouse name: N/A  . Number of children: N/A  . Years of education: N/A   Social History Main Topics  . Smoking status: Never Smoker  . Smokeless tobacco: Never Used  . Alcohol use None  . Drug use: Unknown  . Sexual activity: Not Asked   Other Topics Concern  . None   Social History Narrative  . None   Past Surgical History:  Procedure Laterality Date  . CHOLECYSTECTOMY  2005  . DILATION AND CURETTAGE OF UTERUS  2010   Past Medical History:  Diagnosis Date  . Arthritis   . OA (osteoarthritis) 08/13/2015   BP (!) 167/99   Pulse (!) 112   Resp 14   SpO2 97%  Opioid Risk Score:   Fall Risk Score:  `1  Depression screen PHQ 2/9  [facility] 09/30/2015 08/13/2015  Decreased Interest 0 3  Down, Depressed, Hopeless 0 2  PHQ - 2 Score 0 5  Altered sleeping - 2  Tired, decreased energy - 3  Change in appetite - 3  Feeling bad or failure about yourself  - 2  Trouble concentrating - 1  Moving slowly or fidgety/restless - 3  Suicidal thoughts - 1  PHQ-9 Score - 20  Difficult doing work/chores - Very difficult    Current Outpatient Prescriptions:  .  acetaminophen (TYLENOL) 325 MG tablet, Take 650 mg by mouth every 6 (six) hours as needed., Disp: , Rfl:  .  diclofenac sodium (VOLTAREN) 1 % GEL, Apply 2 g topically 4 (four) times daily., Disp: 1 Tube, Rfl: 1 .  lidocaine (LIDODERM) 5 %, Place 1 patch onto the skin daily. Remove & Discard patch within  12 hours or as directed by MD, Disp: 30 patch, Rfl: 2 .  meloxicam (MOBIC) 15 MG tablet, Take 15 mg by mouth daily., Disp: , Rfl:  .  norethindrone-ethinyl estradiol (JUNEL FE,GILDESS FE,LOESTRIN FE) 1-20 MG-MCG tablet, Take 1 tablet by mouth daily., Disp: , Rfl:  .  omeprazole (PRILOSEC) 20 MG capsule, Take 20 mg by mouth daily., Disp: , Rfl:  .  traMADol (ULTRAM) 50 MG tablet, Take 1 tablet (50 mg total) by mouth every 6 (six) hours as needed., Disp: 120 tablet, Rfl: 0  Review of Systems  Musculoskeletal: Positive for arthralgias and gait problem. Negative for myalgias and back pain.  Neurological: Negative for weakness and numbness.  All other systems reviewed and are negative.     Objective:   Physical Exam HENT: Normocephalic, Atraumatic Eyes: EOMI. No discharge Cardio: RRR. No JVD. Pulm: B/l clear to auscultation.  Effort normal Abd: Obese. Soft, non-distended, non-tender, BS+ MSK:  Gait Antalgic.   No TTP bilateral knees.   Valgus deformity b/l knees  Neuro:  Sensation intact to light touch in all LE dermatomes  Reflexes 1+ throughout (limited by pain and body habitus)  Strength  4+-5/5 in all RLE myotomes (stable)    5/5 in all LLE myotomes Skin: Warm and Dry    Assessment & Plan:  50 y/o female with pmh of arthritis in knees and hands, morbid obesity presents for follow up of R>L knee pain.  1. OA b/l knees  She states xrays revealed lateral compartment OA b/l  Left knee improved with injections  Right knee end stage OA, not a surgical candidate due to BMI  Developed intolerance to Mobic  She will inquire about pool therapy when she has the funds.   Cont HEP, RLE exercises and quad strengthening, and TENS unit (benefit with home TENs) (completed PT)  Pt to follow up on lidoderm patch (states never received)  Cont tramadol 50 BID PRN   Will order Voltaren Gel  Will consider trial Steroid/Hyaluronic acid injection in future  2. Abnormality of gait  Cont cane for  safety  3. Morbid obesity  Cont PT  Encouraged diet compliance and weight loss again  Pt's PCP managing at this time.    Pt does not want dietitian referral at this time  4. Chronic pain syndrome  See above  5. Elevated BP  Pt states it is normally under control and has followed up with her PCP.

## 2016-05-26 ENCOUNTER — Telehealth: Payer: Self-pay

## 2016-05-26 NOTE — Telephone Encounter (Signed)
On January 25,2018 NCCSR was reviewed. No conflict was seen on the [place] Controlled Substance Reporting System with multiple prescribers. If there are any discrepancies this will be reported to her Physician.      

## 2016-07-14 ENCOUNTER — Encounter: Payer: BLUE CROSS/BLUE SHIELD | Admitting: Physical Medicine & Rehabilitation

## 2016-11-23 ENCOUNTER — Ambulatory Visit (INDEPENDENT_AMBULATORY_CARE_PROVIDER_SITE_OTHER): Payer: BLUE CROSS/BLUE SHIELD | Admitting: Family Medicine

## 2016-11-23 ENCOUNTER — Encounter: Payer: Self-pay | Admitting: Family Medicine

## 2016-11-23 DIAGNOSIS — I10 Essential (primary) hypertension: Secondary | ICD-10-CM

## 2016-11-23 DIAGNOSIS — R011 Cardiac murmur, unspecified: Secondary | ICD-10-CM

## 2016-11-23 DIAGNOSIS — M170 Bilateral primary osteoarthritis of knee: Secondary | ICD-10-CM

## 2016-11-23 NOTE — Progress Notes (Signed)
New patient office visit note:  Impression and Recommendations:    1. Heart murmur   2. Essential hypertension   3. Primary osteoarthritis of both knees      No problem-specific Assessment & Plan notes found for this encounter.   The patient was counseled, risk factors were discussed, anticipatory guidance given.   New Prescriptions   No medications on file     Discontinued Medications   ACETAMINOPHEN (TYLENOL) 325 MG TABLET    Take 650 mg by mouth every 6 (six) hours as needed.   DICLOFENAC SODIUM (VOLTAREN) 1 % GEL    Apply 2 g topically 4 (four) times daily.   LIDOCAINE (LIDODERM) 5 %    Place 1 patch onto the skin daily. Remove & Discard patch within 12 hours or as directed by MD      No orders of the defined types were placed in this encounter.    Gross side effects, risk and benefits, and alternatives of medications discussed with patient.  Patient is aware that all medications have potential side effects and we are unable to predict every side effect or drug-drug interaction that may occur.  Expresses verbal understanding and consents to current therapy plan and treatment regimen.  Return for 3 wks- FBW; then OV with me 1 wk later- HTN f/up and Bld wrk discussion.  Please see AVS handed out to patient at the end of our visit for further patient instructions/ counseling done pertaining to today's office visit.    Note: This document was prepared using Dragon voice recognition software and may include unintentional dictation errors.  ----------------------------------------------------------------------------------------------------------------------    Subjective:    Chief complaint:   Chief Complaint  Patient presents with  . Establish Care     HPI: Kristin Alexander is a pleasant 51 y.o. female who presents to [facility] today to review their medical history with me and establish care.   I asked the patient to review  their chronic problem list with me to ensure everything was updated and accurate.    All recent office visits with other providers, any medical records that patient brought in etc  - I reviewed today.     Also asked pt to get me medical records from [facility] providers/ specialists that they had seen within the past 3-5 years- if they are in private practice and/or do not work for a [employer], [facility], [place], [facility] or [employer] owned practice.  Told them to call their specialists to clarify this if they are not sure.   Mom passed 6 yrs ago- pt lost her home.  Sister passed  5 yrs- cellulitis on leg-->   [facility]--> prior pcp.  Wanted to switch.    Single , never been married or sexually active.    GYN- sees regularly.    No problems updated.    Wt Readings from Last 3 Encounters:  12/22/16 288 lb 4.8 oz (130.8 kg)  11/23/16 286 lb 11.2 oz (130 kg)   BP Readings from Last 3 Encounters:  12/22/16 (!) 144/88  11/23/16 (!) 148/86  04/21/16 (!) 167/99   Pulse Readings from Last 3 Encounters:  12/22/16 77  04/21/16 (!) 112  11/12/15 (!) 109   BMI Readings from Last 3 Encounters:  12/22/16 52.73 kg/m  11/23/16 52.44 kg/m    Patient Care Team    Relationship Specialty Notifications Start End  [name], DO PCP - General Family Medicine  11/23/16   Salvatore MarvelWainer, Robert, MD Consulting Physician Orthopedic Surgery  11/23/16   Erick ColaceKirsteins, Andrew E, MD Consulting Physician Physical Medicine and Rehabilitation  11/23/16   Marcelle OverlieGrewal, Michelle, MD Consulting Physician Obstetrics and Gynecology  11/23/16     Patient Active Problem List   Diagnosis Date Noted  . HLD (hyperlipidemia) 12/19/2016    Priority: High  . Hypertriglyceridemia 12/19/2016    Priority: High  . Hypertension 11/23/2016    Priority: High  . Morbid obesity (HCC) 11/12/2015    Priority: High  . OA (osteoarthritis)- b/l knees, some back 08/13/2015    Priority: Medium  . Chronic pain syndrome 11/12/2015     Priority: Low  . Heart murmur 11/23/2016  . Abnormality of gait 11/12/2015     Past Medical History:  Diagnosis Date  . Arthritis   . Heart murmur   . Hypertension   . OA (osteoarthritis) 08/13/2015     Past Medical History:  Diagnosis Date  . Arthritis   . Heart murmur   . Hypertension   . OA (osteoarthritis) 08/13/2015     Past Surgical History:  Procedure Laterality Date  . CHOLECYSTECTOMY  2005  . DILATION AND CURETTAGE OF UTERUS  2010     Family History  Problem Relation Age of Onset  . Hypertension Mother   . Diabetes Mother   . COPD Mother   . Heart disease Mother   . Diabetes Father   . Stroke Father   . Heart disease Father   . Heart attack Sister   . Lupus Sister      History  Drug Use No     History  Alcohol Use No     History  Smoking Status  . Never Smoker  Smokeless Tobacco  . Never Used     Outpatient Encounter Prescriptions as of 11/23/2016  Medication Sig  . norethindrone-ethinyl estradiol (JUNEL FE,GILDESS FE,LOESTRIN FE) 1-20 MG-MCG tablet Take 1 tablet by mouth daily.  Marland Kitchen. omeprazole (PRILOSEC) 20 MG capsule Take 20 mg by mouth daily.  . traMADol (ULTRAM) 50 MG tablet Take 1 tablet (50 mg total) by mouth every 6 (six) hours as needed.  . [DISCONTINUED] acetaminophen (TYLENOL) 325 MG tablet Take 650 mg by mouth every 6 (six) hours as needed.  . [DISCONTINUED] diclofenac sodium (VOLTAREN) 1 % GEL Apply 2 g topically 4 (four) times daily.  . [DISCONTINUED] lidocaine (LIDODERM) 5 % Place 1 patch onto the skin daily. Remove & Discard patch within 12 hours or as directed by MD   No facility-administered encounter medications on file as of 11/23/2016.     Allergies: Codeine and Penicillins   ROS   Objective:   Blood pressure (!) 148/86, height 5\' 2"  (1.575 m), weight 286 lb 11.2 oz (130 kg). Body mass index is 52.44 kg/m. General: Well Developed, well nourished, and in no acute distress.  Neuro: Alert and oriented x3,  extra-ocular muscles intact, sensation grossly intact.  HEENT:[place]/AT, PERRLA, neck supple, No carotid bruits Skin: no gross rashes  Cardiac: Regular rate and rhythm Respiratory: Essentially clear to auscultation bilaterally. Not using accessory muscles, speaking in full sentences.  Abdominal: not grossly distended Musculoskeletal: Ambulates w/o diff, FROM * 4 ext.  Vasc: less 2 sec cap RF, warm and pink  Psych:  No HI/SI, judgement and insight good, Euthymic mood. Full Affect.    Recent Results (from the past 2160 hour(s))  CBC With Differential     Status: Abnormal   Collection Time: 12/12/16  8:28 AM  Result Value Ref Range   WBC 8.8 3.4 - 10.8 x10E3/uL   RBC 4.86 3.77 - 5.28 x10E6/uL   Hemoglobin 14.1 11.1 - 15.9 g/dL   Hematocrit 16.1 09.6 - 46.6 %   MCV 86 79 - 97 fL   MCH 29.0 26.6 - 33.0 pg   MCHC 33.9 31.5 - 35.7 g/dL   RDW 04.5 40.9 - 81.1 %   Neutrophils 58 Not Estab. %   Lymphs 36 Not Estab. %   Monocytes 5 Not Estab. %   Eos 1 Not Estab. %   Basos 0 Not Estab. %   Neutrophils Absolute 5.0 1.4 - 7.0 x10E3/uL   Lymphocytes Absolute 3.2 (H) 0.7 - 3.1 x10E3/uL   Monocytes Absolute 0.5 0.1 - 0.9 x10E3/uL   EOS (ABSOLUTE) 0.1 0.0 - 0.4 x10E3/uL   Basophils Absolute 0.0 0.0 - 0.2 x10E3/uL   Immature Granulocytes 0 Not Estab. %   Immature Grans (Abs) 0.0 0.0 - 0.1 x10E3/uL  Comprehensive metabolic panel     Status: None   Collection Time: 12/12/16  8:28 AM  Result Value Ref Range   Glucose 81 65 - 99 mg/dL   BUN 8 6 - 24 mg/dL   Creatinine, Ser 9.14 0.57 - 1.00 mg/dL   GFR calc non Af Amer 105 >59 mL/min/1.73   GFR calc Af Amer 121 >59 mL/min/1.73   BUN/Creatinine Ratio 13 9 - 23   Sodium 139 134 - 144 mmol/L   Potassium 4.9 3.5 - 5.2 mmol/L   Chloride 102 96 - 106 mmol/L   CO2 22 20 - 29 mmol/L   Calcium 9.0 8.7 - 10.2 mg/dL   Total Protein 6.8 6.0 - 8.5 g/dL   Albumin 4.0 3.5 - 5.5 g/dL   Globulin, Total 2.8 1.5 - 4.5 g/dL   Albumin/Globulin Ratio 1.4 1.2 -  2.2   Bilirubin Total 0.3 0.0 - 1.2 mg/dL   Alkaline Phosphatase 86 39 - 117 IU/L   AST 13 0 - 40 IU/L   ALT 11 0 - 32 IU/L  Hemoglobin A1c     Status: None   Collection Time: 12/12/16  8:28 AM  Result Value Ref Range   Hgb A1c MFr Bld 5.1 4.8 - 5.6 %    Comment:          Prediabetes: 5.7 - 6.4          Diabetes: >6.4          Glycemic control for adults with diabetes: <7.0    Est. average glucose Bld gHb Est-mCnc 100 mg/dL  Lipid panel     Status: Abnormal   Collection Time: 12/12/16  8:28 AM  Result Value Ref Range   Cholesterol, Total 203 (H) 100 - 199 mg/dL   Triglycerides 782 (H) 0 - 149 mg/dL   HDL 41 >95 mg/dL   VLDL Cholesterol Cal 35 5 - 40 mg/dL   LDL Calculated 621 (H) 0 - 99 mg/dL   Chol/HDL Ratio 5.0 (H) 0.0 - 4.4 ratio    Comment:                                   T. Chol/HDL Ratio  Men  Women                               1/2 Avg.Risk  3.4    3.3                                   Avg.Risk  5.0    4.4                                2X Avg.Risk  9.6    7.1                                3X Avg.Risk 23.4   11.0   VITAMIN D 25 Hydroxy (Vit-D Deficiency, Fractures)     Status: Abnormal   Collection Time: 12/12/16  8:28 AM  Result Value Ref Range   Vit D, 25-Hydroxy 15.6 (L) 30.0 - 100.0 ng/mL    Comment: Vitamin D deficiency has been defined by the Institute of Medicine and an Endocrine Society practice guideline as a level of serum 25-OH vitamin D less than 20 ng/mL (1,2). The Endocrine Society went on to further define vitamin D insufficiency as a level between 21 and 29 ng/mL (2). 1. IOM (Institute of Medicine). 2010. Dietary reference    intakes for calcium and D. [place]: The    [employer]. 2. Holick MF, Binkley [place], Bischoff-Ferrari HA, et al.    Evaluation, treatment, and prevention of vitamin D    deficiency: an Endocrine Society clinical practice    guideline. JCEM. 2011 Jul; 96(7):1911-30.    TSH     Status: None   Collection Time: 12/12/16  8:28 AM  Result Value Ref Range   TSH 3.400 0.450 - 4.500 uIU/mL  T4, free     Status: None   Collection Time: 12/12/16  8:28 AM  Result Value Ref Range   Free T4 1.32 0.82 - 1.77 ng/dL

## 2016-11-23 NOTE — Patient Instructions (Signed)
130/80 or less--> you're going to go home and we are going to check her blood pressure at random times.  Not just when you're uptight or relax but random times.  Write it down and I want you to follow-up with me in 4 weeks.  - Also you're going to make an appointment about one week before you're four-week follow-up with me and come in for just fasting blood work.  So when you come in to follow-up with me we will discuss the blood work and recheck her blood pressure and go over your blood pressure home log that you bring in     Please realize, EXERCISE IS MEDICINE!  -  American Heart Association [facility]( AHA) guidelines for exercise : If you are in good health, without any medical conditions, you should engage in 150 minutes of moderate intensity aerobic activity per week.  This means you should be huffing and puffing throughout your workout.   Engaging in regular exercise will improve brain function and memory, as well as improve mood, boost immune system and help with weight management.  As well as the other, more well-known effects of exercise such as decreasing blood sugar levels, decreasing blood pressure,  and decreasing bad cholesterol levels/ increasing good cholesterol levels.     -  The AHA strongly endorses consumption of a diet that contains a variety of foods from all the food categories with an emphasis on fruits and vegetables; fat-free and low-fat dairy products; cereal and grain products; legumes and nuts; and fish, poultry, and/or extra lean meats.    Excessive food intake, especially of foods high in saturated and trans fats, sugar, and salt, should be avoided.    Adequate water intake of roughly 1/2 of your weight in pounds, should equal the ounces of water per day you should drink.  So for instance, if you're 200 pounds, that would be 100 ounces of water per day.         Mediterranean Diet  Why follow it? Research shows. . Those who follow the Mediterranean diet have a reduced risk of  heart disease  . The diet is associated with a reduced incidence of Parkinson's and Alzheimer's diseases . People following the diet may have longer life expectancies and lower rates of chronic diseases  . The Dietary Guidelines for Americans recommends the Mediterranean diet as an eating plan to promote health and prevent disease  What Is the Mediterranean Diet?  . Healthy eating plan based on typical foods and recipes of Mediterranean-style cooking . The diet is primarily a plant based diet; these foods should make up a majority of meals   Starches - Plant based foods should make up a majority of meals - They are an important sources of vitamins, minerals, energy, antioxidants, and fiber - Choose whole grains, foods high in fiber and minimally processed items  - Typical grain sources include wheat, oats, barley, corn, brown rice, bulgar, farro, millet, polenta, couscous  - Various types of beans include chickpeas, lentils, fava beans, black beans, white beans   Fruits  Veggies - Large quantities of antioxidant rich fruits & veggies; 6 or more servings  - Vegetables can be eaten raw or lightly drizzled with oil and cooked  - Vegetables common to the traditional Mediterranean Diet include: artichokes, arugula, beets, broccoli, brussel sprouts, cabbage, carrots, celery, collard greens, cucumbers, eggplant, kale, leeks, lemons, lettuce, mushrooms, okra, onions, peas, peppers, potatoes, pumpkin, radishes, rutabaga, shallots, spinach, sweet potatoes, turnips, zucchini - Fruits common to  the Mediterranean Diet include: apples, apricots, avocados, cherries, clementines, dates, figs, grapefruits, grapes, melons, nectarines, oranges, peaches, pears, pomegranates, strawberries, tangerines  Fats - Replace butter and margarine with healthy oils, such as olive oil, canola oil, and tahini  - Limit nuts to no more than a handful a day  - Nuts include walnuts, almonds, pecans, pistachios, pine nuts  - Limit or  avoid candied, honey roasted or heavily salted nuts - Olives are central to the [employer] diet - can be eaten whole or used in a variety of dishes   Meats Protein - Limiting red meat: no more than a few times a month - When eating red meat: choose lean cuts and keep the portion to the size of deck of cards - Eggs: approx. 0 to 4 times a week  - Fish and lean poultry: at least 2 a week  - Healthy protein sources include, chicken, [place], lean beef, lamb - Increase intake of seafood such as tuna, salmon, trout, mackerel, shrimp, scallops - Avoid or limit high fat processed meats such as sausage and bacon  Dairy - Include moderate amounts of low fat dairy products  - Focus on healthy dairy such as fat free yogurt, skim milk, low or reduced fat cheese - Limit dairy products higher in fat such as whole or 2% milk, cheese, ice cream  Alcohol - Moderate amounts of red wine is ok  - No more than 5 oz daily for women (all ages) and men older than age 52  - No more than 10 oz of wine daily for men younger than 6665  Other - Limit sweets and other desserts  - Use herbs and spices instead of salt to flavor foods  - Herbs and spices common to the traditional Mediterranean Diet include: basil, bay leaves, chives, cloves, cumin, fennel, garlic, lavender, marjoram, mint, oregano, parsley, pepper, rosemary, sage, savory, sumac, tarragon, thyme   It's not just a diet, it's a lifestyle:  . The Mediterranean diet includes lifestyle factors typical of those in the region  . Foods, drinks and meals are best eaten with others and savored . Daily physical activity is important for overall good health . This could be strenuous exercise like running and aerobics . This could also be more leisurely activities such as walking, housework, yard-work, or taking the stairs . Moderation is the key; a balanced and healthy diet accommodates most foods and drinks . Consider portion sizes and frequency of consumption of  certain foods   Meal Ideas & Options:  . Breakfast:  o Whole wheat toast or whole wheat English muffins with peanut butter & hard boiled egg o Steel cut oats topped with apples & cinnamon and skim milk  o Fresh fruit: banana, strawberries, melon, berries, peaches  o Smoothies: strawberries, bananas, greek yogurt, peanut butter o Low fat greek yogurt with blueberries and granola  o Egg white omelet with spinach and mushrooms o Breakfast couscous: whole wheat couscous, apricots, skim milk, cranberries  . Sandwiches:  o Hummus and grilled vegetables (peppers, zucchini, squash) on whole wheat bread   o Grilled chicken on whole wheat pita with lettuce, tomatoes, cucumbers or tzatziki  o Tuna salad on whole wheat bread: tuna salad made with greek yogurt, olives, red peppers, capers, green onions o Garlic rosemary lamb pita: lamb sauted with garlic, rosemary, salt & pepper; add lettuce, cucumber, greek yogurt to pita - flavor with lemon juice and black pepper  . Seafood:  o Mediterranean grilled salmon, seasoned with  garlic, basil, parsley, lemon juice and black pepper o Shrimp, lemon, and spinach whole-grain pasta salad made with low fat greek yogurt  o Seared scallops with lemon orzo  o Seared tuna steaks seasoned salt, pepper, coriander topped with tomato mixture of olives, tomatoes, olive oil, minced garlic, parsley, green onions and cappers  . Meats:  o Herbed greek chicken salad with kalamata olives, cucumber, feta  o Red bell peppers stuffed with spinach, bulgur, lean ground beef (or lentils) & topped with feta   o Kebabs: skewers of chicken, tomatoes, onions, zucchini, squash  o [place] burgers: made with red onions, mint, dill, lemon juice, feta cheese topped with roasted red peppers . Vegetarian o Cucumber salad: cucumbers, artichoke hearts, celery, red onion, feta cheese, tossed in olive oil & lemon juice  o Hummus and whole grain pita points with a greek salad (lettuce, tomato, feta,  olives, cucumbers, red onion) o Lentil soup with celery, carrots made with vegetable broth, garlic, salt and pepper  o Tabouli salad: parsley, bulgur, mint, scallions, cucumbers, tomato, radishes, lemon juice, olive oil, salt and pepper.     Check out the DASH diet = 1.5 Gram Low Sodium Diet   A 1.5 gram sodium diet restricts the amount of sodium in the diet to no more than 1.5 g or 1500 mg daily.  The American Heart Association recommends Americans over the age of 38 to consume no more than 1500 mg of sodium each day to reduce the risk of developing high blood pressure.  Research also shows that limiting sodium may reduce heart attack and stroke risk.  Many foods contain sodium for flavor and sometimes as a preservative.  When the amount of sodium in a diet needs to be low, it is important to know what to look for when choosing foods and drinks.  The following includes some information and guidelines to help make it easier for you to adapt to a low sodium diet.    QUICK TIPS  Do not add salt to food.  Avoid convenience items and fast food.  Choose unsalted snack foods.  Buy lower sodium products, often labeled as "lower sodium" or "no salt added."  Check food labels to learn how much sodium is in 1 serving.  When eating at a restaurant, ask that your food be prepared with less salt or none, if possible.    READING FOOD LABELS FOR SODIUM INFORMATION  The nutrition facts label is a good place to find how much sodium is in foods. Look for products with no more than 400 mg of sodium per serving.  Remember that 1.5 g = 1500 mg.  The food label may also list foods as:  Sodium-free: Less than 5 mg in a serving.  Very low sodium: 35 mg or less in a serving.  Low-sodium: 140 mg or less in a serving.  Light in sodium: 50% less sodium in a serving. For example, if a food that usually has 300 mg of sodium is changed to become light in sodium, it will have 150 mg of sodium.  Reduced sodium: 25% less  sodium in a serving. For example, if a food that usually has 400 mg of sodium is changed to reduced sodium, it will have 300 mg of sodium.    CHOOSING FOODS  Grains  Avoid: Salted crackers and snack items. Some cereals, including instant hot cereals. Bread stuffing and biscuit mixes. Seasoned rice or pasta mixes.  Choose: Unsalted snack items. Low-sodium cereals, oats, puffed wheat  and rice, shredded wheat. English muffins and bread. Pasta.  Meats  Avoid: Salted, canned, smoked, spiced, pickled meats, including fish and poultry. Bacon, ham, sausage, cold cuts, hot dogs, anchovies.  Choose: Low-sodium canned tuna and salmon. Fresh or frozen meat, poultry, and fish.  Dairy  Avoid: Processed cheese and spreads. Cottage cheese. Buttermilk and condensed milk. Regular cheese.  Choose: Milk. Low-sodium cottage cheese. Yogurt. Sour cream. Low-sodium cheese.  Fruits and Vegetables  Avoid: Regular canned vegetables. Regular canned tomato sauce and paste. Frozen vegetables in sauces. Olives. Rosita Fire. Relishes. Sauerkraut.  Choose: Low-sodium canned vegetables. Low-sodium tomato sauce and paste. Frozen or fresh vegetables. Fresh and frozen fruit.  Condiments  Avoid: Canned and packaged gravies. Worcestershire sauce. Tartar sauce. Barbecue sauce. Soy sauce. Steak sauce. Ketchup. Onion, garlic, and table salt. Meat flavorings and tenderizers.  Choose: Fresh and dried herbs and spices. Low-sodium varieties of mustard and ketchup. Lemon juice. Tabasco sauce. Horseradish.    SAMPLE 1.5 GRAM SODIUM MEAL PLAN:   Breakfast / Sodium (mg)  1 cup low-fat milk / 143 mg  1 whole-wheat English muffin / 240 mg  1 tbs heart-healthy margarine / 153 mg  1 hard-boiled egg / 139 mg  1 small orange / 0 mg  Lunch / Sodium (mg)  1 cup raw carrots / 76 mg  2 tbs no salt added peanut butter / 5 mg  2 slices whole-wheat bread / 270 mg  1 tbs jelly / 6 mg   cup red grapes / 2 mg  Dinner / Sodium (mg)  1 cup whole-wheat  pasta / 2 mg  1 cup low-sodium tomato sauce / 73 mg  3 oz lean ground beef / 57 mg  1 small side salad (1 cup raw spinach leaves,  cup cucumber,  cup yellow bell pepper) with 1 tsp olive oil and 1 tsp red wine vinegar / 25 mg  Snack / Sodium (mg)  1 container low-fat vanilla yogurt / 107 mg  3 graham cracker squares / 127 mg  Nutrient Analysis  Calories: 1745  Protein: 75 g  Carbohydrate: 237 g  Fat: 57 g  Sodium: 1425 mg  Document Released: 04/18/2005 Document Revised: 12/29/2010 Document Reviewed: 07/20/2009  [facility] Patient Information 2012 [place], [place].

## 2016-12-12 ENCOUNTER — Other Ambulatory Visit: Payer: BLUE CROSS/BLUE SHIELD

## 2016-12-12 DIAGNOSIS — M159 Polyosteoarthritis, unspecified: Secondary | ICD-10-CM

## 2016-12-12 DIAGNOSIS — M150 Primary generalized (osteo)arthritis: Principal | ICD-10-CM

## 2016-12-12 DIAGNOSIS — G894 Chronic pain syndrome: Secondary | ICD-10-CM

## 2016-12-12 DIAGNOSIS — R5383 Other fatigue: Secondary | ICD-10-CM

## 2016-12-12 DIAGNOSIS — R269 Unspecified abnormalities of gait and mobility: Secondary | ICD-10-CM

## 2016-12-12 DIAGNOSIS — M8949 Other hypertrophic osteoarthropathy, multiple sites: Secondary | ICD-10-CM

## 2016-12-12 DIAGNOSIS — Z0000 Encounter for general adult medical examination without abnormal findings: Secondary | ICD-10-CM

## 2016-12-12 DIAGNOSIS — R011 Cardiac murmur, unspecified: Secondary | ICD-10-CM

## 2016-12-13 LAB — CBC WITH DIFFERENTIAL
BASOS: 0 %
Basophils Absolute: 0 10*3/uL (ref 0.0–0.2)
EOS (ABSOLUTE): 0.1 10*3/uL (ref 0.0–0.4)
EOS: 1 %
HEMATOCRIT: 41.6 % (ref 34.0–46.6)
HEMOGLOBIN: 14.1 g/dL (ref 11.1–15.9)
Immature Grans (Abs): 0 10*3/uL (ref 0.0–0.1)
Immature Granulocytes: 0 %
LYMPHS ABS: 3.2 10*3/uL — AB (ref 0.7–3.1)
Lymphs: 36 %
MCH: 29 pg (ref 26.6–33.0)
MCHC: 33.9 g/dL (ref 31.5–35.7)
MCV: 86 fL (ref 79–97)
MONOCYTES: 5 %
MONOS ABS: 0.5 10*3/uL (ref 0.1–0.9)
NEUTROS ABS: 5 10*3/uL (ref 1.4–7.0)
Neutrophils: 58 %
RBC: 4.86 x10E6/uL (ref 3.77–5.28)
RDW: 13.5 % (ref 12.3–15.4)
WBC: 8.8 10*3/uL (ref 3.4–10.8)

## 2016-12-13 LAB — COMPREHENSIVE METABOLIC PANEL
A/G RATIO: 1.4 (ref 1.2–2.2)
ALBUMIN: 4 g/dL (ref 3.5–5.5)
ALT: 11 IU/L (ref 0–32)
AST: 13 IU/L (ref 0–40)
Alkaline Phosphatase: 86 IU/L (ref 39–117)
BUN / CREAT RATIO: 13 (ref 9–23)
BUN: 8 mg/dL (ref 6–24)
Bilirubin Total: 0.3 mg/dL (ref 0.0–1.2)
CO2: 22 mmol/L (ref 20–29)
CREATININE: 0.61 mg/dL (ref 0.57–1.00)
Calcium: 9 mg/dL (ref 8.7–10.2)
Chloride: 102 mmol/L (ref 96–106)
GFR calc Af Amer: 121 mL/min/{1.73_m2} (ref >59)
GFR calc non Af Amer: 105 mL/min/{1.73_m2} (ref >59)
GLOBULIN, TOTAL: 2.8 g/dL (ref 1.5–4.5)
Glucose: 81 mg/dL (ref 65–99)
POTASSIUM: 4.9 mmol/L (ref 3.5–5.2)
SODIUM: 139 mmol/L (ref 134–144)
Total Protein: 6.8 g/dL (ref 6.0–8.5)

## 2016-12-13 LAB — LIPID PANEL
CHOLESTEROL TOTAL: 203 mg/dL — AB (ref 100–199)
Chol/HDL Ratio: 5 ratio — ABNORMAL HIGH (ref 0.0–4.4)
HDL: 41 mg/dL (ref >39)
LDL CALC: 127 mg/dL — AB (ref 0–99)
Triglycerides: 175 mg/dL — ABNORMAL HIGH (ref 0–149)
VLDL CHOLESTEROL CAL: 35 mg/dL (ref 5–40)

## 2016-12-13 LAB — HEMOGLOBIN A1C
Est. average glucose Bld gHb Est-mCnc: 100 mg/dL
HEMOGLOBIN A1C: 5.1 % (ref 4.8–5.6)

## 2016-12-13 LAB — VITAMIN D 25 HYDROXY (VIT D DEFICIENCY, FRACTURES): Vit D, 25-Hydroxy: 15.6 ng/mL — ABNORMAL LOW (ref 30.0–100.0)

## 2016-12-13 LAB — T4, FREE: Free T4: 1.32 ng/dL (ref 0.82–1.77)

## 2016-12-13 LAB — TSH: TSH: 3.4 u[IU]/mL (ref 0.450–4.500)

## 2016-12-14 ENCOUNTER — Other Ambulatory Visit: Payer: BLUE CROSS/BLUE SHIELD

## 2016-12-19 ENCOUNTER — Encounter: Payer: Self-pay | Admitting: Family Medicine

## 2016-12-19 DIAGNOSIS — E785 Hyperlipidemia, unspecified: Secondary | ICD-10-CM | POA: Insufficient documentation

## 2016-12-19 DIAGNOSIS — E781 Pure hyperglyceridemia: Secondary | ICD-10-CM | POA: Insufficient documentation

## 2016-12-22 ENCOUNTER — Ambulatory Visit (INDEPENDENT_AMBULATORY_CARE_PROVIDER_SITE_OTHER): Payer: BLUE CROSS/BLUE SHIELD | Admitting: Family Medicine

## 2016-12-22 ENCOUNTER — Encounter: Payer: Self-pay | Admitting: Family Medicine

## 2016-12-22 VITALS — BP 144/88 | HR 77 | Ht 62.0 in | Wt 288.3 lb

## 2016-12-22 DIAGNOSIS — I10 Essential (primary) hypertension: Secondary | ICD-10-CM

## 2016-12-22 DIAGNOSIS — R5383 Other fatigue: Secondary | ICD-10-CM

## 2016-12-22 DIAGNOSIS — E559 Vitamin D deficiency, unspecified: Secondary | ICD-10-CM

## 2016-12-22 DIAGNOSIS — E785 Hyperlipidemia, unspecified: Secondary | ICD-10-CM

## 2016-12-22 DIAGNOSIS — E781 Pure hyperglyceridemia: Secondary | ICD-10-CM

## 2016-12-22 MED ORDER — VITAMIN D (ERGOCALCIFEROL) 1.25 MG (50000 UNIT) PO CAPS: 50000.0000 [IU] | ORAL_CAPSULE | ORAL | 10 refills | Status: AC

## 2016-12-22 MED ORDER — VITAMIN D3 125 MCG (5000 UT) PO TABS: ORAL_TABLET | ORAL | 3 refills | Status: AC

## 2016-12-22 MED ORDER — LOSARTAN POTASSIUM-HCTZ 100-12.5 MG PO TABS: ORAL_TABLET | ORAL | 0 refills | Status: DC

## 2016-12-22 NOTE — Progress Notes (Signed)
Impression and Recommendations:    1. Vitamin D deficiency   2. Fatigue, unspecified type   3. Essential hypertension   4. Hyperlipidemia, unspecified hyperlipidemia type   5. Hypertriglyceridemia     No problem-specific Assessment & Plan notes found for this encounter.    Education and routine counseling performed. Handouts provided.   New Prescriptions   CHOLECALCIFEROL (VITAMIN D3) 5000 UNITS TABS    5,000 IU OTC vitamin D3 daily.   LOSARTAN-HYDROCHLOROTHIAZIDE (HYZAAR) 100-12.5 MG TABLET    0.5 tablet daily 1 week, then 1 tab daily   VITAMIN D, ERGOCALCIFEROL, (DRISDOL) 50000 UNITS CAPS CAPSULE    Take 1 capsule (50,000 Units total) by mouth every 7 (seven) days.    Discontinued Medications   LIDOCAINE (LIDODERM) 5 %    Place 1 patch onto the skin daily. Remove & Discard patch within 12 hours or as directed by MD    Modified Medications   No medications on file    No orders of the defined types were placed in this encounter.    Return for 4-6 wks, reck BP - started new med.  The patient was counseled, risk factors were discussed, anticipatory guidance given.  Gross side effects, risk and benefits, and alternatives of medications discussed with patient.  Patient is aware that all medications have potential side effects and we are unable to predict every side effect or drug-drug interaction that may occur.  Expresses verbal understanding and consents to current therapy plan and treatment regimen.  Please see AVS handed out to patient at the end of our visit for further patient instructions/ counseling done pertaining to today's office visit.    Note: This document was prepared using Dragon voice recognition software and may include unintentional dictation errors.     Subjective:    Chief Complaint  Patient presents with  . Follow-up    HPI: Kristin Alexander is a 51 y.o. female who presents to [facility] today for follow up  for HTN.     No problems updated.   HTN:  -  Her blood pressure has not been controlled at home.  Blood pressure has been running in the 130s to high 140s\ 80s- 90s.   - No Bp meds   - Smoking Status noted - never  - She denies new onset of: chest pain, exercise intolerance, shortness of breath, dizziness, visual changes, headache, lower extremity swelling or claudication.  Patient does have concerns that she feels sluggish at times when she knows her blood pressures not well controlled.   - When patient eats barbecue or eats ham she can feel her blood pressure rise and feels sick and yucky at that time.   Today their BP is BP: (!) 144/88   Last 3 blood pressure readings in our office are as follows: BP Readings from Last 3 Encounters:  12/22/16 (!) 144/88  11/23/16 (!) 148/86  04/21/16 (!) 167/99    Pulse Readings from Last 3 Encounters:  12/22/16 77  04/21/16 (!) 112  11/12/15 (!) 109    Filed Weights   12/22/16 1607  Weight: 288 lb 4.8 oz (130.8 kg)      Patient Care Team    Relationship Specialty Notifications Start End  [name], DO PCP - General Family Medicine  11/23/16   Salvatore Marvel, MD Consulting Physician Orthopedic Surgery  11/23/16   Erick Colace, MD Consulting Physician Physical Medicine and Rehabilitation  11/23/16   Vincente Poli,  Marcelino Duster, MD Consulting Physician Obstetrics and Gynecology  11/23/16      Lab Results  Component Value Date   CREATININE 0.61 12/12/2016   BUN 8 12/12/2016   NA 139 12/12/2016   K 4.9 12/12/2016   CL 102 12/12/2016   CO2 22 12/12/2016    Lab Results  Component Value Date   CHOL 203 (H) 12/12/2016   CHOL 207 (A) 10/27/2015    Lab Results  Component Value Date   HDL 41 12/12/2016   HDL 46 10/27/2015    Lab Results  Component Value Date   LDLCALC 127 (H) 12/12/2016   LDLCALC 131 10/27/2015    Lab Results  Component Value Date   TRIG 175 (H) 12/12/2016   TRIG 149 10/27/2015    Lab Results    Component Value Date   CHOLHDL 5.0 (H) 12/12/2016    No results found for: LDLDIRECT ===================================================================   Patient Active Problem List   Diagnosis Date Noted  . HLD (hyperlipidemia) 12/19/2016    Priority: High  . Hypertriglyceridemia 12/19/2016    Priority: High  . Hypertension 11/23/2016    Priority: High  . Morbid obesity (HCC) 11/12/2015    Priority: High  . OA (osteoarthritis)- b/l knees, some back 08/13/2015    Priority: Medium  . Chronic pain syndrome 11/12/2015    Priority: Low  . Heart murmur 11/23/2016  . Abnormality of gait 11/12/2015     Past Medical History:  Diagnosis Date  . Arthritis   . Heart murmur   . Hypertension   . OA (osteoarthritis) 08/13/2015     Past Surgical History:  Procedure Laterality Date  . CHOLECYSTECTOMY  2005  . DILATION AND CURETTAGE OF UTERUS  2010     Family History  Problem Relation Age of Onset  . Hypertension Mother   . Diabetes Mother   . COPD Mother   . Heart disease Mother   . Diabetes Father   . Stroke Father   . Heart disease Father   . Heart attack Sister   . Lupus Sister      History  Drug Use No  ,  History  Alcohol Use No  ,  History  Smoking Status  . Never Smoker  Smokeless Tobacco  . Never Used  ,    Current Outpatient Prescriptions on File Prior to Visit  Medication Sig Dispense Refill  . norethindrone-ethinyl estradiol (JUNEL FE,GILDESS FE,LOESTRIN FE) 1-20 MG-MCG tablet Take 1 tablet by mouth daily.    Marland Kitchen omeprazole (PRILOSEC) 20 MG capsule Take 20 mg by mouth daily.    . traMADol (ULTRAM) 50 MG tablet Take 1 tablet (50 mg total) by mouth every 6 (six) hours as needed. 120 tablet 0   No current facility-administered medications on file prior to visit.      Allergies  Allergen Reactions  . Codeine Nausea Only  . Penicillins Other (See Comments)     Review of Systems:   General:  Denies fever, chills Optho/Auditory:   Denies  visual changes, blurred vision Respiratory:   Denies SOB, cough, wheeze, DIB  Cardiovascular:   Denies chest pain, palpitations, painful respirations Gastrointestinal:   Denies nausea, vomiting, diarrhea.  Endocrine:     Denies new hot or cold intolerance Musculoskeletal:  Denies joint swelling, gait issues, or new unexplained myalgias/ arthralgias Skin:  Denies rash, suspicious lesions  Neurological:    Denies dizziness, unexplained weakness, numbness  Psychiatric/Behavioral:   Denies mood changes  Objective:    Blood pressure Marland Kitchen)  144/88, pulse 77, height 5\' 2"  (1.575 m), weight 288 lb 4.8 oz (130.8 kg).  Body mass index is 52.73 kg/m.  General: Well Developed, well nourished, and in no acute distress.  HEENT: Normocephalic, atraumatic, pupils equal round reactive to light, neck supple, No carotid bruits, no JVD Skin: Warm and dry, cap RF less 2 sec Cardiac: Regular rate and rhythm, S1, S2 WNL's, no murmurs rubs or gallops Respiratory: ECTA B/L, Not using accessory muscles, speaking in full sentences. NeuroM-Sk: Ambulates w/o assistance, moves ext * 4 w/o difficulty, sensation grossly intact.  Ext: scant edema b/l lower ext Psych: No HI/SI, judgement and insight good, Euthymic mood. Full Affect.

## 2016-12-22 NOTE — Patient Instructions (Addendum)
Based on your current cholesterol panel and your blood pressure your current wrist to have a heart attack or stroke in the next 10 years is 3 times that of a normal person your age however, it's only 2.7 and for this she did not need medications just lifestyle modification.  However, we do need to control the blood pressure  ( goal of 130/80 or less)  and get that under better control which we will start her on a blood pressure medicine today please check your blood pressure at home as you have been doing and let me know sooner than you plan to follow-up if you're having any problems.        Nine ways to increase your "good" HDL cholesterol  High-density lipoprotein (HDL) is often referred to as the "good" cholesterol. Having high HDL levels helps carry cholesterol from your arteries to your liver, where it can be used or excreted.  Having high levels of HDL also has antioxidant and anti-inflammatory effects, and is linked to a reduced risk of heart disease (1, 2).  Most health experts recommend minimum blood levels of 40 mg/dl in men and 50 mg/dl in women.  While genetics definitely play a role, there are several other factors that affect HDL levels.  Here are nine healthy ways to raise your "good" HDL cholesterol.  1. Consume olive oil  two pieces of salmon on a plate olive oil being poured into a small dish Extra virgin olive oil may be more healthful than processed olive oils. Olive oil is one of the healthiest fats around.  A large analysis of 42 studies with more than 800,000 participants found that olive oil was the only source of monounsaturated fat that seemed to reduce heart disease risk (3).  Research has shown that one of olive oil's heart-healthy effects is an increase in HDL cholesterol. This effect is thought to be caused by antioxidants it contains called polyphenols (4, 5, 6, 7).  Extra virgin olive oil has more polyphenols than more processed olive oils, although  the amount can still vary among different types and brands.  One study gave 200 healthy young men about 2 tablespoons (25 ml) of different olive oils per day for three weeks.  The researchers found that participants' HDL levels increased significantly more after they consumed the olive oil with the highest polyphenol content (6).  In another study, when 11 older adults consumed about 4 tablespoons (50 ml) of high-polyphenol extra virgin olive oil every day for six weeks, their HDL cholesterol increased by 6.5 mg/dl, on average (7).  In addition to raising HDL levels, olive oil has been found to boost HDL's anti-inflammatory and antioxidant function in studies of older people and individuals with high cholesterol levels ( 7, 8, 9).  Whenever possible, select high-quality, certified extra virgin olive oils, which tend to be highest in polyphenols.  Bottom line: Extra virgin olive oil with a high polyphenol content has been shown to increase HDL levels in healthy people, the elderly and individuals with high cholesterol.  2. Follow a low-carb or ketogenic diet  Low-carb and ketogenic diets provide a number of health benefits, including weight loss and reduced blood sugar levels.  They have also been shown to increase HDL cholesterol in people who tend to have lower levels.  This includes those who are obese, insulin resistant or diabetic (10, 11, 12, 13, 14, 15, 16, 17).  In one study, people with type 2 diabetes were split into two groups.  One followed a diet consuming less than 50 grams of carbs per day. The other followed a high-carb diet.  Although both groups lost weight, the low-carb group's HDL cholesterol increased almost twice as much as the high-carb group's did (14).  In another study, obese people who followed a low-carb diet experienced an increase in HDL cholesterol of 5 mg/dl overall.  Meanwhile, in the same study, the participants who ate a low-fat, high-carb diet showed a  decrease in HDL cholesterol (15).  This response may partially be due to the higher levels of fat people typically consume on low-carb diets.  One study in overweight women found that diets high in meat and cheese increased HDL levels by 5-8%, compared to a higher-carb diet (18).  What's more, in addition to raising HDL cholesterol, very-low-carb diets have been shown to decrease triglycerides and improve several other risk factors for heart disease (13, 14, 16, 17).  Bottom line: Low-carb and ketogenic diets typically increase HDL cholesterol levels in people with diabetes, metabolic syndrome and obesity.  3. Exercise regularly  Being physically active is important for heart health.  Studies have shown that many different types of exercise are effective at raising HDL cholesterol, including strength training, high-intensity exercise and aerobic exercise (19, 20, 21, 22, 23, 24).  However, the biggest increases in HDL are typically seen with high-intensity exercise.  One small study followed women who were living with polycystic ovary syndrome (PCOS), which is linked to a higher risk of insulin resistance. The study required them to perform high-intensity exercise three times a week.  The exercise led to an increase in HDL cholesterol of 8 mg/dL after 10 weeks. The women also showed improvements in other health markers, including decreased insulin resistance and improved arterial function (23).  In a 12-week study, overweight men who performed high-intensity exercise experienced a 10% increase in HDL cholesterol.  In contrast, the low-intensity exercise group showed only a 2% increase and the endurance training group experienced no change (24).  However, even lower-intensity exercise seems to increase HDL's anti-inflammatory and antioxidant capacities, whether or not HDL levels change (20, 21, 25).  Overall, high-intensity exercise such as high-intensity interval training (HIIT) and  high-intensity circuit training (HICT) may boost HDL cholesterol levels the most.  Bottom line: Exercising several times per week can help raise HDL cholesterol and enhance its anti-inflammatory and antioxidant effects. High-intensity forms of exercise may be especially effective.  4. Add coconut oil to your diet  Studies have shown that coconut oil may reduce appetite, increase metabolic rate and help protect brain health, among other benefits.  Some people may be concerned about coconut oil's effects on heart health due to its high saturated fat content.  However, it appears that coconut oil is actually quite heart healthy.  Coconut oil tends to raise HDL cholesterol more than many other types of fat.  In addition, it may improve the ratio of low-density-lipoprotein (LDL) cholesterol, the "bad" cholesterol, to HDL cholesterol. Improving this ratio reduces heart disease risk (26, 27, 28, 29).  One study examined the health effects of coconut oil on 40 women with excess belly fat. The researchers found that participants who took coconut oil daily experienced increased HDL cholesterol and a lower LDL-to-HDL ratio.  In contrast, the group who took soybean oil daily had a decrease in HDL cholesterol and an increase in the LDL-to-HDL ratio (29).  Most studies have found these health benefits occur at a dosage of about 2 tablespoons (30 ml) of  coconut oil per day. It's best to incorporate this into cooking rather than eating spoonfuls of coconut oil on their own.  Bottom line: Consuming 2 tablespoons (30 ml) of coconut oil per day may help increase HDL cholesterol levels.  5. Stop smoking  cigarette butt Quitting smoking can reduce the risk of heart disease and lung cancer. Smoking increases the risk of many health problems, including heart disease and lung cancer (30).  One of its negative effects is a suppression of HDL cholesterol.  Some studies have found that quitting smoking can  increase HDL levels. Indeed, one study found no significant differences in HDL levels between former smokers and people who had never smoked (31, 32, 33, 34, 35).  In a one-year study of more than 1,500 people, those who quit smoking had twice the increase in HDL as those who resumed smoking within the year. The number of large HDL particles also increased, which further reduced heart disease risk (32).  One study followed smokers who switched from traditional cigarettes to electronic cigarettes for one year. They found that the switch was associated with an increase in HDL cholesterol of 5 mg/dl, on average (33).  When it comes to the effect of nicotine replacement patches on HDL levels, research results have been mixed.  One study found that nicotine replacement therapy led to higher HDL cholesterol. However, other research suggests that people who use nicotine patches likely won't see increases in HDL levels until after replacement therapy is completed (34, 36).  Even in studies where HDL cholesterol levels didn't increase after people quit smoking, HDL function improved, resulting in less inflammation and other beneficial effects on heart health (37).  Bottom line: Quitting smoking can increase HDL levels, improve HDL function and help protect heart health.  6. Lose weight  When overweight and obese people lose weight, their HDL cholesterol levels usually increase.  What's more, this benefit seems to occur whether weight loss is achieved by calorie counting, carb restriction, intermittent fasting, weight loss surgery or a combination of diet and exercise (16, 38, 39, 40, 41, 42).  One study examined HDL levels in more than 3,000 overweight and obese [place] adults who followed a lifestyle modification program for one year.  The researchers found that losing at least 6.6 lbs (3 kg) led to an increase in HDL cholesterol of 4 mg/dl, on average (41).  In another study, when obese people with  type 2 diabetes consumed calorie-restricted diets that provided 20-30% of calories from protein, they experienced significant increases in HDL cholesterol levels (42).  The key to achieving and maintaining healthy HDL cholesterol levels is choosing the type of diet that makes it easiest for you to lose weight and keep it off.  Bottom Line: Several methods of weight loss have been shown to increase HDL cholesterol levels in people who are overweight or obese.  7. Choose purple produce  Consuming purple-colored fruits and vegetables is a delicious way to potentially increase HDL cholesterol.  Purple produce contains antioxidants known as anthocyanins.  Studies using anthocyanin extracts have shown that they help fight inflammation, protect your cells from damaging free radicals and may also raise HDL cholesterol levels (43, 44, 45, 46).  In a 24-week study of 58 people with diabetes, those who took an anthocyanin supplement twice a day experienced a 19% increase in HDL cholesterol, on average, along with other improvements in heart health markers (45).  In another study, when people with cholesterol issues took anthocyanin extract for 12  weeks, their HDL cholesterol levels increased by 13.7% (46).  Although these studies used extracts instead of foods, there are several fruits and vegetables that are very high in anthocyanins. These include eggplant, purple corn, red cabbage, blueberries, blackberries and black raspberries.  Bottom line: Consuming fruits and vegetables rich in anthocyanins may help increase HDL cholesterol levels.  8. Eat fatty fish often  The omega-3 fats in fatty fish provide major benefits to heart health, including a reduction in inflammation and better functioning of the cells that line your arteries (47, 48).  There's some research showing that eating fatty fish or taking fish oil may also help raise low levels of HDL cholesterol (49, 50, 51, 52, 53).  In a study of  33 heart disease patients, participants that consumed fatty fish four times per week experienced an increase in HDL cholesterol levels. The particle size of their HDL also increased (52).  In another study, overweight men who consumed herring five days a week for six weeks had a 5% increase in HDL cholesterol, compared with their levels after eating lean pork and chicken five days a week (53).  However, there are a few studies that found no increase in HDL cholesterol in response to increased fish or omega-3 supplement intake (54, 55).  In addition to herring, other types of fatty fish that may help raise HDL cholesterol include salmon, sardines, mackerel and anchovies.  Bottom line: Eating fatty fish several times per week may help increase HDL cholesterol levels and provide other benefits to heart health.  9. Avoid artificial trans fats  Artificial trans fats have many negative health effects due to their inflammatory properties (56, 57).  There are two types of trans fats. One kind occurs naturally in animal products, including full-fat dairy.  In contrast, the artificial trans fats found in margarines and processed foods are created by adding hydrogen to unsaturated vegetable and seed oils. These fats are also known as industrial trans fats or partially hydrogenated fats.  Research has shown that, in addition to increasing inflammation and contributing to several health problems, these artificial trans fats may lower HDL cholesterol levels.  In one study, researchers compared how people's HDL levels responded when they consumed different margarines.  The study found that participants' HDL cholesterol levels were 10% lower after consuming margarine containing partially hydrogenated soybean oil, compared to their levels after consuming palm oil (58).  Another controlled study followed 40 adults who had diets high in different types of trans fats.  They found that HDL cholesterol levels in  women were significantly lower after they consumed the diet high in industrial trans fats, compared to the diet containing naturally occurring trans fats (59).  To protect heart health and keep HDL cholesterol in the healthy range, it's best to avoid artificial trans fats altogether.  Bottom line: Artificial trans fats have been shown to lower HDL levels and increase inflammation, compared to other fats.  Take home message  Although your HDL cholesterol levels are partly determined by your genetics, there are many things you can do to naturally increase your own levels.  Fortunately, the practices that raise HDL cholesterol often provide other health benefits as well.   Guidelines for a Low Cholesterol, Low Saturated Fat Diet   Fats - Limit total intake of fats and oils. - Avoid butter, stick margarine, shortening, lard, palm and coconut oils. - Limit mayonnaise, salad dressings, gravies and sauces, unless they are homemade with low-fat ingredients. - Limit chocolate. - Choose low-fat  and nonfat products, such as low-fat mayonnaise, low-fat or non-hydrogenated peanut butter, low-fat or fat-free salad dressings and nonfat gravy. - Use vegetable oil, such as canola or olive oil. - Look for margarine that does not contain trans fatty acids. - Use nuts in moderate amounts. - Read ingredient labels carefully to determine both amount and type of fat present in foods. Limit saturated and trans fats! - Avoid high-fat processed and convenience foods.  Meats and Meat Alternatives - Choose fish, chicken, [place] and lean meats. - Use dried beans, peas, lentils and tofu. - Limit egg yolks to three to four per week. - If you eat red meat, limit to no more than three servings per week and choose loin or round cuts. - Avoid fatty meats, such as bacon, sausage, franks, luncheon meats and ribs. - Avoid all organ meats, including liver.  Dairy - Choose nonfat or low-fat milk, yogurt and cottage  cheese. - Most cheeses are high in fat. Choose cheeses made from non-fat milk, such as mozzarella and ricotta cheese. - Choose light or fat-free cream cheese and sour cream. - Avoid cream and sauces made with cream.  Fruits and Vegetables - Eat a wide variety of fruits and vegetables. - Use lemon juice, vinegar or "mist" olive oil on vegetables. - Avoid adding sauces, fat or oil to vegetables.  Breads, Cereals and Grains - Choose whole-grain breads, cereals, pastas and rice. - Avoid high-fat snack foods, such as granola, cookies, pies, pastries, doughnuts and croissants.  Cooking Tips - Avoid deep fried foods. - Trim visible fat off meats and remove skin from poultry before cooking. - Bake, broil, boil, poach or roast poultry, fish and lean meats. - Drain and discard fat that drains out of meat as you cook it. - Add little or no fat to foods. - Use vegetable oil sprays to grease pans for cooking or baking. - Steam vegetables. - Use herbs or no-oil marinades to flavor foods.

## 2017-01-09 ENCOUNTER — Encounter: Payer: Self-pay | Admitting: Family Medicine

## 2017-01-09 ENCOUNTER — Ambulatory Visit: Payer: BLUE CROSS/BLUE SHIELD

## 2017-01-09 ENCOUNTER — Ambulatory Visit (INDEPENDENT_AMBULATORY_CARE_PROVIDER_SITE_OTHER): Payer: BLUE CROSS/BLUE SHIELD | Admitting: Family Medicine

## 2017-01-09 DIAGNOSIS — M25561 Pain in right knee: Secondary | ICD-10-CM

## 2017-01-09 DIAGNOSIS — G894 Chronic pain syndrome: Secondary | ICD-10-CM | POA: Diagnosis not present

## 2017-01-09 DIAGNOSIS — M222X1 Patellofemoral disorders, right knee: Secondary | ICD-10-CM | POA: Diagnosis not present

## 2017-01-09 DIAGNOSIS — M170 Bilateral primary osteoarthritis of knee: Secondary | ICD-10-CM | POA: Diagnosis not present

## 2017-01-09 DIAGNOSIS — I10 Essential (primary) hypertension: Secondary | ICD-10-CM | POA: Diagnosis not present

## 2017-01-09 DIAGNOSIS — G8929 Other chronic pain: Secondary | ICD-10-CM

## 2017-01-09 NOTE — Progress Notes (Signed)
Impression and Recommendations:    1. Morbid obesity (HCC)   2. Essential hypertension   3. Chronic pain of right knee   4. Primary osteoarthritis of both knees   5. Patellofemoral pain syndrome of right knee   6. Chronic pain syndrome    After your injection of steroids today.  Please go home ice your knee 1520 minutes 3-4 times a day.  Your knee will get worse over the next 24- 48 hrs before the steroids kick in and it slowly improves. .     Morbid obesity (HCC) - Wt loss strongly recommended. - Declined nutrition consult or weight loss counseling consult  Essential hypertension - Improved from prior. - Continue Hyzaar. - Diet/  lifestyle modifications discussed  Chronic pain of right knee  - Plan: DG Knee 1-2 Views Right - X-rays obtained which showed very severe DJD.   - Plain film x-rays were personally reviewed with the patient and all questions were answered. - Discussed with patient due to her BMI being over 50, palpation of the joint was difficult but patient still wished me to proceed with procedure, and declined me sending her to sports med for ultrasound guidance. - Procedure performed without incidence or complication.  She tolerated well - Preprocedure pain was 10\10;  post procedure pain was 3\10, pt was pleased - Patient wishes for me to inject her left knee in the near future.  I told her to wait at least a couple weeks before we do that. - If pain is not relieved for a good 3-4 months.  We can always send you to sports med to see about hyaluronic analog injections in the future   Patellofemoral pain syndrome of right knee - I discussed with patient how her patella is displaced laterally due to weak medial quadriceps muscles. - She declined physical therapy.   - Told pt to Please see the handouts on patellofemoral syndrome and exercises you can do at home.    Chronic pain syndrome  - Patient takes tramadol as needed. Procedure:   -Injection of  R  knee- medial anterior approach -Performed by Dr. [name]  -Consent obtained after discussion of risks benefits and alternatives of procedure; patient wished to proceed. -Time-out conducted. -Noted no overlying erythema, warmth, induration, or other signs of local infection. -Skin prepped in a sterile fashion. -Topical analgesic spray: Ethyl chloride/ FreezEase. Completed without complication or difficulty.  Medication injected without resistance after negative aspiration.  Patient tolerated well.  EBL: Less than 0.1 mL Meds: 1cc 2% lidocaine, 2cc 0.5% marcaine, 1 mL Depo-Medrol 40 mg per mL Injection site dressed with sterile bandage. -Pain immediately improved by over 50%, suggesting accurate placement of the medication. - Ice 15-20 minutes 3-4 times per day for the next 48 hours and any time after repetitive activity thereafter Advised to call if fevers/chills, erythema, induration, drainage, or bleeding.   Follow-up as discussed   The patient was counseled, risk factors were discussed, anticipatory guidance given.   Orders Placed This Encounter  Procedures  . DG Knee 1-2 Views Right     Gross side effects, risk and benefits, and alternatives of medications and treatment plan in general discussed with patient.  Patient is aware that all medications have potential side effects and we are unable to predict every side effect or drug-drug interaction that may occur.   Patient will call with any questions prior to using medication if they have concerns.  Expresses verbal understanding and consents  to current therapy and treatment regimen.  No barriers to understanding were identified.  Red flag symptoms and signs discussed in detail.  Patient expressed understanding regarding what to do in case of emergency\urgent symptoms  Please see AVS handed out to patient at the end of our visit for further patient instructions/ counseling done pertaining to today's office visit.   Return for  couple weeks- reck knee and possibly discuss wt loss meds.     Note: This document was prepared using Dragon voice recognition software and may include unintentional dictation errors.  Thomasene Loteborah Emelio Schneller 10:56 AM --------------------------------------------------------------------------------------------------------------------------------------------------------------------------------------------------------------------------------------------    Subjective:    CC:  Chief Complaint  Patient presents with  . Knee Pain    biltat, patient states that the right knee is worse    HPI: Kristin Alexander is a 51 y.o. female who [facility] for issues as discussed below.  HTN:  Patient is here today for follow-up of her blood pressure.  Last office visit we started her on losartan-hydrochlorothiazide - SHE IS UP TO ONE TAB QD.  She's been checking her blood pressure a couple times a day since last seen.  They've been well controlled in the 110s-120s over 70s.  No symptoms of dizziness lightheadedness visual change chest pain exercise intolerance etc  Knee pain:    Many many yrs-  R is worst.   Told in past bone on bone and arthritis.  Had steroid shots prior.   Had 3 euflexa's in past.   Last injection 2 yrs ago in Feb.  10/10 pain last couple days.  At best--> is 6/10 pain.    Steroid injections last 3-4 wks for her in the past.  Helped and was worth it for pt.   she got roughly the same amount of pain relief with the Euflex type injections vs steroid injecitons.  Morbid obesity: Discussed patient's weight and extra stress and strain on her joints.  Strongly advised to go to a nutritionist\weight loss counselor which patient declined.  Encouraged to join [employer].   Problem  Chronic Pain of Right Knee  Patellofemoral Pain Syndrome of Right Knee     Wt Readings from Last 3 Encounters:  01/09/17 286 lb (129.7 kg)  12/22/16 288 lb 4.8 oz  (130.8 kg)  11/23/16 286 lb 11.2 oz (130 kg)   BP Readings from Last 3 Encounters:  01/09/17 138/76  12/22/16 (!) 144/88  11/23/16 (!) 148/86   Pulse Readings from Last 3 Encounters:  01/09/17 92  12/22/16 77  04/21/16 (!) 112   BMI Readings from Last 3 Encounters:  01/09/17 52.31 kg/m  12/22/16 52.73 kg/m  11/23/16 52.44 kg/m     Patient Care Team    Relationship Specialty Notifications Start End  Thomasene Lotpalski, Maley Venezia, DO PCP - General Family Medicine  11/23/16   Salvatore MarvelWainer, Robert, MD Consulting Physician Orthopedic Surgery  11/23/16   Erick ColaceKirsteins, Andrew E, MD Consulting Physician Physical Medicine and Rehabilitation  11/23/16   Marcelle OverlieGrewal, Michelle, MD Consulting Physician Obstetrics and Gynecology  11/23/16      Patient Active Problem List   Diagnosis Date Noted  . HLD (hyperlipidemia) 12/19/2016    Priority: High  . Hypertriglyceridemia 12/19/2016    Priority: High  . Hypertension 11/23/2016    Priority: High  . Morbid obesity (HCC) 11/12/2015    Priority: High  . OA (osteoarthritis)- b/l knees, some back 08/13/2015    Priority: Medium  . Chronic pain syndrome 11/12/2015  Priority: Low  . Chronic pain of right knee 01/09/2017  . Patellofemoral pain syndrome of right knee 01/09/2017  . Heart murmur 11/23/2016  . Abnormality of gait 11/12/2015    Past Medical history, Surgical history, Family history, Social history, Allergies and Medications have been entered into the medical record, reviewed and changed as needed.    Current Meds  Medication Sig  . Cholecalciferol (VITAMIN D3) 5000 units TABS 5,000 IU OTC vitamin D3 daily.  Marland Kitchen losartan-hydrochlorothiazide (HYZAAR) 100-12.5 MG tablet 0.5 tablet daily 1 week, then 1 tab daily  . norethindrone-ethinyl estradiol (JUNEL FE,GILDESS FE,LOESTRIN FE) 1-20 MG-MCG tablet Take 1 tablet by mouth daily.  Marland Kitchen omeprazole (PRILOSEC) 20 MG capsule Take 20 mg by mouth daily.  . traMADol (ULTRAM) 50 MG tablet Take 1 tablet (50 mg total)  by mouth every 6 (six) hours as needed.  . Vitamin D, Ergocalciferol, (DRISDOL) 50000 units CAPS capsule Take 1 capsule (50,000 Units total) by mouth every 7 (seven) days.    Allergies:  Allergies  Allergen Reactions  . Codeine Nausea Only  . Penicillins Other (See Comments)     Review of Systems: General:   Denies fever, chills, unexplained weight loss.  Optho/Auditory:   Denies visual changes, blurred vision/LOV Respiratory:   Denies wheeze, DOE more than baseline levels.  Cardiovascular:   Denies chest pain, palpitations, new onset peripheral edema  Gastrointestinal:   Denies nausea, vomiting, diarrhea, abd pain.  Genitourinary: Denies dysuria, freq/ urgency, flank pain or discharge from genitals.  Endocrine:     Denies hot or cold intolerance, polyuria, polydipsia. Musculoskeletal:   Denies unexplained myalgias, joint swelling, unexplained arthralgias, gait problems.  Skin:  Denies new onset rash, suspicious lesions Neurological:     Denies dizziness, unexplained weakness, numbness  Psychiatric/Behavioral:   Denies mood changes, suicidal or homicidal ideations, hallucinations    Objective:   Blood pressure 138/76, pulse 92, height  (1.575 m), weight 286 lb (129.7 kg). Body mass index is 52.31 kg/m. General:  Well Developed, well nourished, appropriate for stated age.  Neuro:  Alert and oriented,  extra-ocular muscles intact  HEENT:  Normocephalic, atraumatic, neck supple, no carotid bruits appreciated  Skin:  no gross rash, warm, pink. Cardiac:  RRR, S1 S2 Respiratory:  ECTA B/L and A/P, Not using accessory muscles, speaking in full sentences- unlabored. Vascular:  Ext warm, no cyanosis apprec.; cap RF less 2 sec. Psych:  No HI/SI, judgement and insight good, Euthymic mood. Full Affect.

## 2017-01-09 NOTE — Patient Instructions (Addendum)
After your injection of steroids today.  Please go home ice your knee 1520 minutes 3-4 times a day.  Your knee will get worse over the next 24- 48 hrs. before then steroids kick in and it slowly improves.  If your pain is not relieved for a good 3-4 months.  We can always send her to sports med to see about hyaluronic analog injections in the future.  Needs wt loss--> weight watchers is recommended!!  Please see the handout on patellofemoral syndrome and exercises you can do at home.     Patellofemoral Pain Syndrome Patellofemoral pain syndrome is a condition that involves a softening or breakdown of the tissue (cartilage) on the underside of your kneecap (patella). This causes pain in the front of the knee. The condition is also called runner's knee or chondromalacia patella. Patellofemoral pain syndrome is most common in young adults who are active in sports. Your knee is the largest joint in your body. The patella covers the front of your knee and is attached to muscles above and below your knee. The underside of the patella is covered with a smooth type of cartilage (synovium). The smooth surface helps the patella glide easily when you move your knee. Patellofemoral pain syndrome causes swelling in the joint linings and bone surfaces in your knee. What are the causes? Patellofemoral pain syndrome can be caused by:  Overuse.  Poor alignment of your knee joints.  Weak leg muscles.  A direct blow to your kneecap.  What increases the risk? You may be at risk for patellofemoral pain syndrome if you:  Do a lot of activities that can wear down your kneecap. These include: ? Running. ? Squatting. ? Climbing stairs.  Start a new physical activity or exercise program.  Wear shoes that do not fit well.  Do not have good leg strength.  Are overweight.  What are the signs or symptoms? Knee pain is the most common symptom of patellofemoral pain syndrome. This may feel like a dull,  aching pain underneath your patella, in the front of your knee. There may be a popping or cracking sound when you move your knee. Pain may get worse with:  Exercise.  Climbing stairs.  Running.  Jumping.  Squatting.  Kneeling.  Sitting for a long time.  Moving or pushing on your patella.  How is this diagnosed? Your health care provider may be able to diagnose patellofemoral pain syndrome from your symptoms and medical history. You may be asked about your recent physical activities and which ones cause knee pain. Your health care provider may do a physical exam with certain tests to confirm the diagnosis. These may include:  Moving your patella back and forth.  Checking your range of knee motion.  Having you squat or jump to see if you have pain.  Checking the strength of your leg muscles.  An MRI of the knee may also be done. How is this treated? Patellofemoral pain syndrome can usually be treated at home with rest, ice, compression, and elevation (RICE). Other treatments may include:  Nonsteroidal anti-inflammatory drugs (NSAIDs).  Physical therapy to stretch and strengthen your leg muscles.  Shoe inserts (orthotics) to take stress off your knee.  A knee brace or knee support.  Surgery to remove damaged cartilage or move the patella to a better position. The need for surgery is rare.  Follow these instructions at home:  Take medicines only as directed by your health care provider.  Rest your knee. ? When  resting, keep your knee raised above the level of your heart. ? Avoid activities that cause knee pain.  Apply ice to the injured area: ? Put ice in a plastic bag. ? Place a towel between your skin and the bag. ? Leave the ice on for 20 minutes, 2-3 times a day.  Use splints, braces, knee supports, or walking aids as directed by your health care provider.  Perform stretching and strengthening exercises as directed by your health care provider or physical  therapist.  Keep all follow-up visits as directed by your health care provider. This is important. Contact a health care provider if:  Your symptoms get worse.  You are not improving with home care. This information is not intended to replace advice given to you by your health care provider. Make sure you discuss any questions you have with your health care provider. Document Released: 04/06/2009 Document Revised: 09/24/2015 Document Reviewed: 07/08/2013 Elsevier Interactive Patient Education  2018 [employer].

## 2017-01-16 MED ORDER — METHYLPREDNISOLONE ACETATE 40 MG/ML IJ SUSP: 40.0000 mg | Freq: Once | INTRAMUSCULAR | Status: DC

## 2017-01-16 MED ORDER — LIDOCAINE HCL (PF) 2 % IJ SOLN: 1.0000 mL | Freq: Once | INTRAMUSCULAR | Status: DC

## 2017-01-16 MED ORDER — BUPIVACAINE HCL 0.5 % IJ SOLN: 2.0000 mL | Freq: Once | INTRAMUSCULAR | Status: DC

## 2017-01-16 NOTE — Addendum Note (Signed)
Addended by: Leda Min D on: 01/16/2017 10:26 AM   Modules accepted: Orders

## 2017-01-23 ENCOUNTER — Ambulatory Visit: Payer: BLUE CROSS/BLUE SHIELD | Admitting: Family Medicine

## 2017-01-30 ENCOUNTER — Ambulatory Visit (INDEPENDENT_AMBULATORY_CARE_PROVIDER_SITE_OTHER): Payer: BLUE CROSS/BLUE SHIELD | Admitting: Family Medicine

## 2017-01-30 ENCOUNTER — Encounter: Payer: Self-pay | Admitting: Family Medicine

## 2017-01-30 DIAGNOSIS — M170 Bilateral primary osteoarthritis of knee: Secondary | ICD-10-CM

## 2017-01-30 DIAGNOSIS — I10 Essential (primary) hypertension: Secondary | ICD-10-CM

## 2017-01-30 DIAGNOSIS — E559 Vitamin D deficiency, unspecified: Secondary | ICD-10-CM | POA: Diagnosis not present

## 2017-01-30 NOTE — Progress Notes (Signed)
Wt loss OV note   Impression and Recommendations:    1. Morbid obesity (HCC)   2. Essential hypertension   3. Primary osteoarthritis of both knees   4. Vitamin D deficiency Active      Essential hypertension - Continue her current medications.  Well controlled at home.  Primary osteoarthritis of both knees - Patient needs are much improved from prior but still she is in significant amount of pain and finds it difficult to ambulate at times.  We discussed need for weight loss.  Vitamin D deficiency, Active - Continue current vitamin D supplementation.  Recheck 4-6 months.  Morbid obesity (HCC) - Weight Mgt: Explained to patient what BMI refers to, and what it means medically.    Told patient to think about it as a "medical risk stratification measurement" and how increasing BMI is associated with increasing risk/ or worsening state of various diseases such as hypertension, hyperlipidemia, diabetes, premature OA, depression etc.  -  We specifically discussed various weight loss options to include but not limited to diet such as Weight Watchers, weight loss medicine such as phentermine etc.  Patient states she doesn't have the money to join a weight loss program right now and does not have the desire to go on any weight loss medications as she does not like to take any medicines and fears side effects.    - She declined nutrition consultation and also declined referral to bariatrics.  - We had a long discussion about my fitness pal and lose it app and the like.  We discussed how it is difficult to change dietary habits if you do not know exactly what you're doing right in which are doing wrong.  She agrees to track everything she puts in her mouth for a period of 2-4 weeks and then make a follow-up office visit to discuss and review with me.  Her goal is just to make sure she tracks everything that she gains awareness of her habits.    - no goals for "wt loss" per se- we just want  patient to focus on tracking everything she puts in her mouth.  Last 4 weeks she lost 4 pounds, which is fantastic, but explained to her she needs to be aware which she is eating to make a change  Pt was in the office today for 40+ minutes, with over 50% time spent in face to face counseling of patients various medical conditions, treatment plans of those medical conditions including medicine management and lifestyle modification, strategies to improve health and well being; and in coordination of care. SEE ABOVE FOR DETAILS  -Reminded patient the need for yearly complete physical exam office visits in addition to office visits for management of the chronic diseases  The patient was counseled, risk factors were discussed, anticipatory guidance given.   Return for healthy diet follow up- 4-6 wks .   Please see AVS handed out to patient at the end of our visit for further patient instructions/ counseling done pertaining to today's office visit.    Note: This document was prepared using Dragon voice recognition software and may include unintentional dictation errors.  Kristin Alexander 4:47 PM   ______________________________________________________________________    Subjective:  HPI: Kristin Alexander y.o. female  presents for 3 month follow up for multiple medical problems.  Patient was recently seen on 01/09/2017 for injection in her right knee for terrible pain due to osteoarthritis.- her pain is at least 40- 60% decreased from prior.  Prior her pain was a 1010 most days and now it's a 4-6 out of 10.  That's helping her move around a Alexander better.  She still ices occasionally.  She is happy with the outcome.    We also discussed with her last office visit to review her weight and also blood pressure.  HTN:  Start patient on Hyzaar back in end of August on 12/22/2016.Marland Kitchen  Her blood pressures are well controlled at home 130/77, 116/77, 126/79, 108/84, 104/75, 160/77, 114/76, 117/86,  114/83, 116/77, 101/80, 107/78, 119/75.  Patient denies dizzy symptoms at all even when her blood pressure 1 time back on 9\20\18 was 97/71.  Wt:   Since her last office visit with me on 12/22/2016, patient lost 4 pounds. Cutting down on portions and sweets/ caloric drinks and watching what she eats.   Drinking more water.  Drinking about at least 40-60 oz water per day.     Vit D:    Started daily and wkly tabs. tol well , no S-E.   Has not felt any different.    Weight:  Wt Readings from Last 3 Encounters:  01/30/17 284 lb (128.8 kg)  01/09/17 286 lb (129.7 kg)  12/22/16 288 lb 4.8 oz (130.8 kg)   BMI Readings from Last 3 Encounters:  01/30/17 51.94 kg/m  01/09/17 52.31 kg/m  12/22/16 52.73 kg/m   Lab Results  Component Value Date   HGBA1C 5.1 12/12/2016    Review of Systems: General:   No F/C, wt loss Pulm:   No DIB, SOB, pleuritic chest pain Card:  No CP, palpitations Abd:  No n/v/d or pain Ext:  No inc edema from baseline   Objective: Physical Exam: BP 120/78   Pulse 94   Ht  (1.575 m)   Wt 284 lb (128.8 kg)   BMI 51.94 kg/m  Body mass index is 51.94 kg/m. General: Well nourished, in no apparent distress. Eyes: PERRLA, EOMs, conjunctiva clr no swelling or erythema ENT/Mouth: Hearing appears normal.  Mucus Membranes Moist  Neck: Supple, no masses Resp: Respiratory effort- normal, ECTA B/L w/o W/R/R  Cardio: RRR w/o MRGs. Abdomen: no gross distention. Lymphatics:  Brisk peripheral pulses, less 2 sec cap RF, no gross edema  M-sk: Full ROM, 5/5 strength, normal gait.  Skin: Warm, dry without rashes, lesions, ecchymosis.  Neuro: Alert, Oriented Psych: Normal affect, Insight and Judgment appropriate.    Current Medications:  Current Outpatient Prescriptions on File Prior to Visit  Medication Sig Dispense Refill  . Cholecalciferol (VITAMIN D3) 5000 units TABS 5,000 IU OTC vitamin D3 daily. 90 tablet 3  . losartan-hydrochlorothiazide (HYZAAR) 100-12.5  MG tablet 0.5 tablet daily 1 week, then 1 tab daily 90 tablet 0  . norethindrone-ethinyl estradiol (JUNEL FE,GILDESS FE,LOESTRIN FE) 1-20 MG-MCG tablet Take 1 tablet by mouth daily.    Marland Kitchen omeprazole (PRILOSEC) 20 MG capsule Take 20 mg by mouth daily.    . traMADol (ULTRAM) 50 MG tablet Take 1 tablet (50 mg total) by mouth every 6 (six) hours as needed. 120 tablet 0  . Vitamin D, Ergocalciferol, (DRISDOL) 50000 units CAPS capsule Take 1 capsule (50,000 Units total) by mouth every 7 (seven) days. 12 capsule 10   Current Facility-Administered Medications on File Prior to Visit  Medication Dose Route Frequency Provider Last Rate Last Dose  . bupivacaine (MARCAINE) 0.5 % (with pres) injection 2 mL  2 mL Other Once Gar Glance, DO      . lidocaine (XYLOCAINE) 2 % injection 1  mL  1 mL Other Once Cheril Slattery, DO      . methylPREDNISolone acetate (DEPO-MEDROL) injection 40 mg  40 mg Other Once Kristin Lot, DO        Medical History:  Patient Active Problem List   Diagnosis Date Noted  . HLD (hyperlipidemia) 12/19/2016    Priority: High  . Hypertriglyceridemia 12/19/2016    Priority: High  . Hypertension 11/23/2016    Priority: High  . Morbid obesity (HCC) 11/12/2015    Priority: High  . OA (osteoarthritis)- b/l knees, some back 08/13/2015    Priority: Medium  . Chronic pain syndrome 11/12/2015    Priority: Low  . Vitamin D deficiency 01/30/2017  . Chronic pain of right knee 01/09/2017  . Patellofemoral pain syndrome of right knee 01/09/2017  . Heart murmur 11/23/2016  . Abnormality of gait 11/12/2015    Allergies:  Allergies  Allergen Reactions  . Codeine Nausea Only  . Penicillins Other (See Comments)     Family history-  Reviewed; changed as appropriate  Social history-  Reviewed; changed as appropriate

## 2017-01-30 NOTE — Patient Instructions (Addendum)
Your goal for the next time we see is just to track everything you eat or drink in the lose it app.  Does not eat healthy and were not making a weight loss goal but just to track everything you put in your mouth.    Preventing Unhealthy Weight Gain, Adult Staying at a healthy weight is important. When fat builds up in your body, you may become overweight or obese. These conditions put you at greater risk for developing certain health problems, such as heart disease, diabetes, sleeping problems, joint problems, and some cancers. Unhealthy weight gain is often the result of making unhealthy choices in what you eat. It is also a result of not getting enough exercise. You can make changes to your lifestyle to prevent obesity and stay as healthy as possible. What nutrition changes can be made? To maintain a healthy weight and prevent obesity:  Eat only as much as your body needs. To do this: ? Pay attention to signs that you are hungry or full. Stop eating as soon as you feel full. ? If you feel hungry, try drinking water first. Drink enough water so your urine is clear or pale yellow. ? Eat smaller portions. ? Look at serving sizes on food labels. Most foods contain more than one serving per container. ? Eat the recommended amount of calories for your gender and activity level. While most active people should eat around 2,000 calories per day, if you are trying to lose weight or are not very active, you main need to eat less calories. Talk to your health care provider or dietitian about how many calories you should eat each day.  Choose healthy foods, such as: ? Fruits and vegetables. Try to fill at least half of your plate at each meal with fruits and vegetables. ? Whole grains, such as whole wheat bread, brown rice, and quinoa. ? Lean meats, such as chicken or fish. ? Other healthy proteins, such as beans, eggs, or tofu. ? Healthy fats, such as nuts, seeds, fatty fish, and olive oil. ? Low-fat or  fat-free dairy.  Check food labels and avoid food and drinks that: ? Are high in calories. ? Have added sugar. ? Are high in sodium. ? Have saturated fats or trans fats.  Limit how much you eat of the following foods: ? Prepackaged meals. ? Fast food. ? Fried foods. ? Processed meat, such as bacon, sausage, and deli meats. ? Fatty cuts of red meat and poultry with skin.  Cook foods in healthier ways, such as by baking, broiling, or grilling.  When grocery shopping, try to shop around the outside of the store. This helps you buy mostly fresh foods and avoid canned and prepackaged foods.  What lifestyle changes can be made?  Exercise at least 30 minutes 5 or more days each week. Exercising includes brisk walking, yard work, biking, running, swimming, and team sports like basketball and soccer. Ask your health care provider which exercises are safe for you.  Do not use any products that contain nicotine or tobacco, such as cigarettes and e-cigarettes. If you need help quitting, ask your health care provider.  Limit alcohol intake to no more than 1 drink a day for nonpregnant women and 2 drinks a day for men. One drink equals 12 oz of beer, 5 oz of wine, or 1 oz of hard liquor.  Try to get 7-9 hours of sleep each night. What other changes can be made?  Keep a food and activity  journal to keep track of: ? What you ate and how many calories you had. Remember to count sauces, dressings, and side dishes. ? Whether you were active, and what exercises you did. ? Your calorie, weight, and activity goals.  Check your weight regularly. Track any changes. If you notice you have gained weight, make changes to your diet or activity routine.  Avoid taking weight-loss medicines or supplements. Talk to your health care provider before starting any new medicine or supplement.  Talk to your health care provider before trying any new diet or exercise plan. Why are these changes important? Eating  healthy, staying active, and having healthy habits not only help prevent obesity, they also:  Help you to manage stress and emotions.  Help you to connect with friends and family.  Improve your self-esteem.  Improve your sleep.  Prevent long-term health problems.  What can happen if changes are not made? Being obese or overweight can cause you to develop joint or bone problems, which can make it hard for you to stay active or do activities you enjoy. Being obese or overweight also puts stress on your heart and lungs and can lead to health problems like diabetes, heart disease, and some cancers. Where to find more information: Talk with your health care provider or a dietitian about healthy eating and healthy lifestyle choices. You may also find other information through these resources:  [place] [employer] MyPlate: https://ball-collins.biz/  American Heart Association: www.heart.org  Centers for Disease Control and Prevention: FootballExhibition.com.br  Summary  Staying at a healthy weight is important. It helps prevent certain diseases and health problems, such as heart disease, diabetes, joint problems, sleep disorders, and some cancers.  Being obese or overweight can cause you to develop joint or bone problems, which can make it hard for you to stay active or do activities you enjoy.  You can prevent unhealthy weight gain by eating a healthy diet, exercising regularly, not smoking, limiting alcohol, and getting enough sleep.  Talk with your health care provider or a dietitian for guidance about healthy eating and healthy lifestyle choices. This information is not intended to replace advice given to you by your health care provider. Make sure you discuss any questions you have with your health care provider. Document Released: 04/19/2016 Document Revised: 05/25/2016 Document Reviewed: 05/25/2016 Elsevier Interactive Patient Education  [employer].

## 2017-03-20 ENCOUNTER — Other Ambulatory Visit: Payer: Self-pay | Admitting: Obstetrics and Gynecology

## 2017-03-20 DIAGNOSIS — N939 Abnormal uterine and vaginal bleeding, unspecified: Secondary | ICD-10-CM

## 2017-03-21 ENCOUNTER — Other Ambulatory Visit: Payer: Self-pay | Admitting: Family Medicine

## 2017-03-21 DIAGNOSIS — I10 Essential (primary) hypertension: Secondary | ICD-10-CM

## 2017-04-01 ENCOUNTER — Other Ambulatory Visit: Payer: BLUE CROSS/BLUE SHIELD

## 2017-06-19 ENCOUNTER — Other Ambulatory Visit: Payer: Self-pay | Admitting: Family Medicine

## 2017-06-22 ENCOUNTER — Ambulatory Visit: Payer: BLUE CROSS/BLUE SHIELD | Admitting: Family Medicine

## 2017-06-22 ENCOUNTER — Encounter: Payer: Self-pay | Admitting: Family Medicine

## 2017-06-22 DIAGNOSIS — G8929 Other chronic pain: Secondary | ICD-10-CM

## 2017-06-22 DIAGNOSIS — M25561 Pain in right knee: Secondary | ICD-10-CM | POA: Diagnosis not present

## 2017-06-22 DIAGNOSIS — I10 Essential (primary) hypertension: Secondary | ICD-10-CM

## 2017-06-22 DIAGNOSIS — E785 Hyperlipidemia, unspecified: Secondary | ICD-10-CM | POA: Diagnosis not present

## 2017-06-22 DIAGNOSIS — E559 Vitamin D deficiency, unspecified: Secondary | ICD-10-CM

## 2017-06-22 MED ORDER — LOSARTAN POTASSIUM 100 MG PO TABS: 100.0000 mg | ORAL_TABLET | Freq: Every day | ORAL | 1 refills | Status: DC

## 2017-06-22 MED ORDER — HYDROCHLOROTHIAZIDE 12.5 MG PO TABS: 12.5000 mg | ORAL_TABLET | Freq: Every day | ORAL | 1 refills | Status: DC

## 2017-06-22 NOTE — Patient Instructions (Signed)
Hypertension Hypertension, commonly called high blood pressure, is when the force of blood pumping through the arteries is too strong. The arteries are the blood vessels that carry blood from the heart throughout the body. Hypertension forces the heart to work harder to pump blood and may cause arteries to become narrow or stiff. Having untreated or uncontrolled hypertension can cause heart attacks, strokes, kidney disease, and other problems. A blood pressure reading consists of a higher number over a lower number. Ideally, your blood pressure should be below 120/80. The first ("top") number is called the systolic pressure. It is a measure of the pressure in your arteries as your heart beats. The second ("bottom") number is called the diastolic pressure. It is a measure of the pressure in your arteries as the heart relaxes. What are the causes? The cause of this condition is not known. What increases the risk? Some risk factors for high blood pressure are under your control. Others are not. Factors you can change  Smoking.  Having type 2 diabetes mellitus, high cholesterol, or both.  Not getting enough exercise or physical activity.  Being overweight.  Having too much fat, sugar, calories, or salt (sodium) in your diet.  Drinking too much alcohol. Factors that are difficult or impossible to change  Having chronic kidney disease.  Having a family history of high blood pressure.  Age. Risk increases with age.  Race. You may be at higher risk if you are African-American.  Gender. Men are at higher risk than women before age 45. After age 65, women are at higher risk than men.  Having obstructive sleep apnea.  Stress. What are the signs or symptoms? Extremely high blood pressure (hypertensive crisis) may cause:  Headache.  Anxiety.  Shortness of breath.  Nosebleed.  Nausea and vomiting.  Severe chest pain.  Jerky movements you cannot control (seizures).  How is this  diagnosed? This condition is diagnosed by measuring your blood pressure while you are seated, with your arm resting on a surface. The cuff of the blood pressure monitor will be placed directly against the skin of your upper arm at the level of your heart. It should be measured at least twice using the same arm. Certain conditions can cause a difference in blood pressure between your right and left arms. Certain factors can cause blood pressure readings to be lower or higher than normal (elevated) for a short period of time:  When your blood pressure is higher when you are in a health care provider's office than when you are at home, this is called white coat hypertension. Most people with this condition do not need medicines.  When your blood pressure is higher at home than when you are in a health care provider's office, this is called masked hypertension. Most people with this condition may need medicines to control blood pressure.  If you have a high blood pressure reading during one visit or you have normal blood pressure with other risk factors:  You may be asked to return on a different day to have your blood pressure checked again.  You may be asked to monitor your blood pressure at home for 1 week or longer.  If you are diagnosed with hypertension, you may have other blood or imaging tests to help your health care provider understand your overall risk for other conditions. How is this treated? This condition is treated by making healthy lifestyle changes, such as eating healthy foods, exercising more, and reducing your alcohol intake. Your   health care provider may prescribe medicine if lifestyle changes are not enough to get your blood pressure under control, and if:  Your systolic blood pressure is above 130.  Your diastolic blood pressure is above 80.  Your personal target blood pressure may vary depending on your medical conditions, your age, and other factors. Follow these  instructions at home: Eating and drinking  Eat a diet that is high in fiber and potassium, and low in sodium, added sugar, and fat. An example eating plan is called the DASH (Dietary Approaches to Stop Hypertension) diet. To eat this way: ? Eat plenty of fresh fruits and vegetables. Try to fill half of your plate at each meal with fruits and vegetables. ? Eat whole grains, such as whole wheat pasta, brown rice, or whole grain bread. Fill about one quarter of your plate with whole grains. ? Eat or drink low-fat dairy products, such as skim milk or low-fat yogurt. ? Avoid fatty cuts of meat, processed or cured meats, and poultry with skin. Fill about one quarter of your plate with lean proteins, such as fish, chicken without skin, beans, eggs, and tofu. ? Avoid premade and processed foods. These tend to be higher in sodium, added sugar, and fat.  Reduce your daily sodium intake. Most people with hypertension should eat less than 1,500 mg of sodium a day.  Limit alcohol intake to no more than 1 drink a day for nonpregnant women and 2 drinks a day for men. One drink equals 12 oz of beer, 5 oz of wine, or 1 oz of hard liquor. Lifestyle  Work with your health care provider to maintain a healthy body weight or to lose weight. Ask what an ideal weight is for you.  Get at least 30 minutes of exercise that causes your heart to beat faster (aerobic exercise) most days of the week. Activities may include walking, swimming, or biking.  Include exercise to strengthen your muscles (resistance exercise), such as pilates or lifting weights, as part of your weekly exercise routine. Try to do these types of exercises for 30 minutes at least 3 days a week.  Do not use any products that contain nicotine or tobacco, such as cigarettes and e-cigarettes. If you need help quitting, ask your health care provider.  Monitor your blood pressure at home as told by your health care provider.  Keep all follow-up visits as  told by your health care provider. This is important. Medicines  Take over-the-counter and prescription medicines only as told by your health care provider. Follow directions carefully. Blood pressure medicines must be taken as prescribed.  Do not skip doses of blood pressure medicine. Doing this puts you at risk for problems and can make the medicine less effective.  Ask your health care provider about side effects or reactions to medicines that you should watch for. Contact a health care provider if:  You think you are having a reaction to a medicine you are taking.  You have headaches that keep coming back (recurring).  You feel dizzy.  You have swelling in your ankles.  You have trouble with your vision. Get help right away if:  You develop a severe headache or confusion.  You have unusual weakness or numbness.  You feel faint.  You have severe pain in your chest or abdomen.  You vomit repeatedly.  You have trouble breathing. Summary  Hypertension is when the force of blood pumping through your arteries is too strong. If this condition is not   is not controlled, it may put you at risk for serious complications.  Your personal target blood pressure may vary depending on your medical conditions, your age, and other factors. For most people, a normal blood pressure is less than 120/80.  Hypertension is treated with lifestyle changes, medicines, or a combination of both. Lifestyle changes include weight loss, eating a healthy, low-sodium diet, exercising more, and limiting alcohol. This information is not intended to replace advice given to you by your health care provider. Make sure you discuss any questions you have with your health care provider. Document Released: 04/18/2005 Document Revised: 03/16/2016 Document Reviewed: 03/16/2016 Elsevier Interactive Patient Education  2018 [employer].      Behavior Modification Ideas for [employer]  Weight management  involves adopting a healthy lifestyle that includes a knowledge of nutrition and exercise, a positive attitude and the right kind of motivation. Internal motives such as better health, increased energy, self-esteem and personal control increase your chances of lifelong weight management success.  Remember to have realistic goals and think long-term success. Believe in yourself and you can do it. The following information will give you ideas to help you meet your goals.  Control Your Home Environment  Eat only while sitting down at the kitchen or dining room table. Do not eat while watching television, reading, cooking, talking on the phone, standing at the refrigerator or working on the computer. Keep tempting foods out of the house - don't buy them. Keep tempting foods out of sight. Have low-calorie foods ready to eat. Unless you are preparing a meal, stay out of the kitchen. Have healthy snacks at your disposal, such as small pieces of fruit, vegetables, canned fruit, pretzels, low-fat string cheese and nonfat cottage cheese.  Control Your Work Environment  Do not eat at [employer] or keep tempting snacks at your desk. If you get hungry between meals, plan healthy snacks and bring them with you to work. During your breaks, go for a walk instead of eating. If you work around food, plan in advance the one item you will eat at mealtime. Make it inconvenient to nibble on food by chewing gum, sugarless candy or drinking water or another low-calorie beverage. Do not work through meals. Skipping meals slows down metabolism and may result in overeating at the next meal. If food is available for special occasions, either pick the healthiest item, nibble on low-fat snacks brought from home, don't have anything offered, choose one option and have a small amount, or have only a beverage.  Control Your Mealtime Environment  Serve your plate of food at the stove or kitchen counter. Do not put the serving  dishes on the table. If you do put dishes on the table, remove them immediately when finished eating. Fill half of your plate with vegetables, a quarter with lean protein and a quarter with starch. Use smaller plates, bowls and glasses. A smaller portion will look large when it is in a little dish. Politely refuse second helpings. When fixing your plate, limit portions of food to one scoop/serving or less.   Daily Food Management  Replace eating with another activity that you will not associate with food. Wait 20 minutes before eating something you are craving. Drink a large glass of water or diet soda before eating. Always have a big glass or bottle of water to drink throughout the day. Avoid high-calorie add-ons such as cream with your coffee, butter, mayonnaise and salad dressings.  Shopping: Do not shop when  hungry or tired. Shop from a list and avoid buying anything that is not on your list. If you must have tempting foods, buy individual-sized packages and try to find a lower-calorie alternative. Don't taste test in the store. Read food labels. Compare products to help you make the healthiest choices.  Preparation: Chew a piece of gum while cooking meals. Use a quarter teaspoon if you taste test your food. Try to only fix what you are going to eat, leaving yourself no chance for seconds. If you have prepared more food than you need, portion it into individual containers and freeze or refrigerate immediately. Don't snack while cooking meals.  Eating: Eat slowly. Remember it takes about 20 minutes for your stomach to send a message to your brain that it is full. Don't let fake hunger make you think you need more. The ideal way to eat is to take a bite, put your utensil down, take a sip of water, cut your next bite, take a bit, put your utensil down and so on. Do not cut your food all at one time. Cut only as needed. Take small bites and chew your food well. Stop eating for a  minute or two at least once during a meal or snack. Take breaks to reflect and have conversation.  Cleanup and Leftovers: Label leftovers for a specific meal or snack. Freeze or refrigerate individual portions of leftovers. Do not clean up if you are still hungry.  Eating Out and Social Eating  Do not arrive hungry. Eat something light before the meal. Try to fill up on low-calorie foods, such as vegetables and fruit, and eat smaller portions of the high-calorie foods. Eat foods that you like, but choose small portions. If you want seconds, wait at least 20 minutes after you have eaten to see if you are actually hungry or if your eyes are bigger than your stomach. Limit alcoholic beverages. Try a soda water with a twist of lime. Do not skip other meals in the day to save room for the special event.  At Restaurants: Order  la carte rather than buffet style. Order some vegetables or a salad for an appetizer instead of eating bread. If you order a high-calorie dish, share it with someone. Try an after-dinner mint with your coffee. If you do have dessert, share it with two or more people. Don't overeat because you do not want to waste food. Ask for a doggie bag to take extra food home. Tell the server to put half of your entree in a to go bag before the meal is served to you. Ask for salad dressing, gravy or high-fat sauces on the side. Dip the tip of your fork in the dressing before each bite. If bread is served, ask for only one piece. Try it plain without butter or oil. At [employer] where oil and vinegar is served with bread, use only a small amount of oil and a lot of vinegar for dipping.  At a Friend's House: Offer to bring a dish, appetizer or dessert that is low in calories. Serve yourself small portions or tell the host that you only want a small amount. Stand or sit away from the snack table. Stay away from the kitchen or stay busy if you are near the food. Limit your  alcohol intake.  At [employer] and Cafeterias: Cover most of your plate with lettuce and/or vegetables. Use a salad plate instead of a dinner plate. After eating, clear away your dishes before  having coffee or tea.  Entertaining at Home: Explore low-fat, low-cholesterol cookbooks. Use single-serving foods like chicken breasts or hamburger patties. Prepare low-calorie appetizers and desserts.   Holidays: Keep tempting foods out of sight. Decorate the house without using food. Have low-calorie beverages and foods on hand for guests. Allow yourself one planned treat a day. Don't skip meals to save up for the holiday feast. Eat regular, planned meals.   Exercise Well  Make exercise a priority and a planned activity in the day. If possible, walk the entire or part of the distance to work. Get an exercise buddy. Go for a walk with a colleague during one of your breaks, go to the gym, run or take a walk with a friend, walk in the mall with a shopping companion. Park at the end of the parking lot and walk to the store or office entrance. Always take the stairs all of the way or at least part of the way to your floor. If you have a desk job, walk around the office frequently. Do leg lifts while sitting at your desk. Do something outside on the weekends like going for a hike or a bike ride.   Have a Healthy Attitude  Make health your weight management priority. Be realistic. Have a goal to achieve a healthier you, not necessarily the lowest weight or ideal weight based on calculations or tables. Focus on a healthy eating style, not on dieting. Dieting usually lasts for a short amount of time and rarely produces long-term success. Think long term. You are developing new healthy behaviors to follow next month, in a year and in a decade.    This information is for educational purposes only and is not intended to replace the advice of your doctor or health care provider. We encourage you to  discuss with your doctor any questions or concerns you may have.        Guidelines for Losing Weight   We want weight loss that will last so you should lose 1-2 pounds a week.  THAT IS IT! Please pick THREE things a month to change. Once it is a habit check off the item. Then pick another three items off the list to become habits.  If you are already doing a habit on the list GREAT!  Cross that item off!  Don't drink your calories. Ie, alcohol, soda, fruit juice, and sweet tea.   Drink more water. Drink a glass when you feel hungry or before each meal.   Eat breakfast - Complex carb and protein (likeDannon light and fit yogurt, oatmeal, fruit, eggs, [place] bacon).  Measure your cereal.  Eat no more than one cup a day. (ie Kashi)  Eat an apple a day.  Add a vegetable a day.  Try a new vegetable a month.  Use Pam! Stop using oil or butter to cook.  Don't finish your plate or use smaller plates.  Share your dessert.  Eat sugar free Jello for dessert or frozen grapes.  Don't eat 2-3 hours before bed.  Switch to whole wheat bread, pasta, and brown rice.  Make healthier choices when you eat out. No fries!  Pick baked chicken, NOT fried.  Don't forget to SLOW DOWN when you eat. It is not going anywhere.   Take the stairs.  Park far away in the parking lot  Lift soup cans (or weights) for 10 minutes while watching TV.  Walk at work for 10 minutes during break.  Walk outside 1  time a week with your friend, kids, dog, or significant other.  Start a walking group at church.  Walk the mall as much as you can tolerate.   Keep a food diary.  Weigh yourself daily.  Walk for 15 minutes 3 days per week.  Cook at home more often and eat out less. If life happens and you go back to old habits, it is okay.  Just start over. You can do it!  If you experience chest pain, get short of breath, or tired during the exercise, please stop immediately and inform your doctor.     Before you even begin to attack a weight-loss plan, it pays to remember this: You are not fat. You have fat. Losing weight isn't about blame or shame; it's simply another achievement to accomplish. Dieting is like any other skill-you have to buckle down and work at it. As long as you act in a smart, reasonable way, you'll ultimately get where you want to be. Here are some weight loss pearls for you.   1. It's Not a Diet. It's a Lifestyle Thinking of a diet as something you're on and suffering through only for the short term doesn't work. To shed weight and keep it off, you need to make permanent changes to the way you eat. It's OK to indulge occasionally, of course, but if you cut calories temporarily and then revert to your old way of eating, you'll gain back the weight quicker than you can say yo-yo. Use it to lose it. Research shows that one of the best predictors of long-term weight loss is how many pounds you drop in the first month. For that reason, nutritionists often suggest being stricter for the first two weeks of your new eating strategy to build momentum. Cut out added sugar and alcohol and avoid unrefined carbs. After that, figure out how you can reincorporate them in a way that's healthy and maintainable.  2. There's a Right Way to Exercise Working out burns calories and fat and boosts your metabolism by building muscle. But those trying to lose weight are notorious for overestimating the number of calories they burn and underestimating the amount they take in. Unfortunately, your system is biologically programmed to hold on to extra pounds and that means when you start exercising, your body senses the deficit and ramps up its hunger signals. If you're not diligent, you'll eat everything you burn and then some. Use it, to lose it. Cardio gets all the exercise glory, but strength and interval training are the real heroes. They help you build lean muscle, which in turn increases your  metabolism and calorie-burning ability 3. Don't Overreact to Mild Hunger Some people have a hard time losing weight because of hunger anxiety. To them, being hungry is bad-something to be avoided at all costs-so they carry snacks with them and eat when they don't need to. Others eat because they're stressed out or bored. While you never want to get to the point of being ravenous (that's when bingeing is likely to happen), a hunger pang, a craving, or the fact that it's 3:00 p.m. should not send you racing for the vending machine or obsessing about the energy bar in your purse. Ideally, you should put off eating until your stomach is growling and it's difficult to concentrate.  Use it to lose it. When you feel the urge to eat, use the HALT method. Ask yourself, Am I really hungry? Or am I angry or anxious, lonely or bored,  or tired? If you're still not certain, try the apple test. If you're truly hungry, an apple should seem delicious; if it doesn't, something else is going on. Or you can try drinking water and making yourself busy, if you are still hungry try a healthy snack.  4. Not All Calories Are Created Equal The mechanics of weight loss are pretty simple: Take in fewer calories than you use for energy. But the kind of food you eat makes all the difference. Processed food that's high in saturated fat and refined starch or sugar can cause inflammation that disrupts the hormone signals that tell your brain you're full. The result: You eat a lot more.  Use it to lose it. Clean up your diet. Swap in whole, unprocessed foods, including vegetables, lean protein, and healthy fats that will fill you up and give you the biggest nutritional bang for your calorie buck. In a few weeks, as your brain starts receiving regular hunger and fullness signals once again, you'll notice that you feel less hungry overall and naturally start cutting back on the amount you eat.  5. Protein, Produce, and Plant-Based Fats Are Your  Weight-Loss Trinity Here's why eating the three Ps regularly will help you drop pounds. Protein fills you up. You need it to build lean muscle, which keeps your metabolism humming so that you can torch more fat. People in a weight-loss program who ate double the recommended daily allowance for protein (about 110 grams for a 150-pound woman) lost 70 percent of their weight from fat, while people who ate the RDA lost only about 40 percent, one study found. Produce is packed with filling fiber. "It's very difficult to consume too many calories if you're eating a lot of vegetables. Example: Three cups of broccoli is a lot of food, yet only 93 calories. (Fruit is another story. It can be easy to overeat and can contain a lot of calories from sugar, so be sure to monitor your intake.) Plant-based fats like olive oil and those in avocados and nuts are healthy and extra satiating.  Use it to lose it. Aim to incorporate each of the three Ps into every meal and snack. People who eat protein throughout the day are able to keep weight off, according to a study in the American Journal of Clinical Nutrition. In addition to meat, poultry and seafood, good sources are beans, lentils, eggs, tofu, and yogurt. As for fat, keep portion sizes in check by measuring out salad dressing, oil, and nut butters (shoot for one to two tablespoons). Finally, eat veggies or a little fruit at every meal. People who did that consumed 308 fewer calories but didn't feel any hungrier than when they didn't eat more produce.  7. How You Eat Is As Important As What You Eat In order for your brain to register that you're full, you need to focus on what you're eating. Sit down whenever you eat, preferably at a table. Turn off the TV or computer, put down your phone, and look at your food. Smell it. Chew slowly, and don't put another bite on your fork until you swallow. When women ate lunch this attentively, they consumed 30 percent less when  snacking later than those who listened to an audiobook at lunchtime, according to a study in the [place] Journal of Nutrition. 8. Weighing Yourself Really Works The scale provides the best evidence about whether your efforts are paying off. Seeing the numbers tick up or down or stagnate is motivation to  keep going-or to rethink your approach. A 2015 study at [facility] found that daily weigh-ins helped people lose more weight, keep it off, and maintain that loss, even after two years. Use it to lose it. Step on the scale at the same time every day for the best results. If your weight shoots up several pounds from one weigh-in to the next, don't freak out. Eating a lot of salt the night before or having your period is the likely culprit. The number should return to normal in a day or two. It's a steady climb that you need to do something about. 9. Too Much Stress and Too Little Sleep Are Your Enemies When you're tired and frazzled, your body cranks up the production of cortisol, the stress hormone that can cause carb cravings. Not getting enough sleep also boosts your levels of ghrelin, a hormone associated with hunger, while suppressing leptin, a hormone that signals fullness and satiety. People on a diet who slept only five and a half hours a night for two weeks lost 55 percent less fat and were hungrier than those who slept eight and a half hours, according to a study in the [place] [employer] Journal. Use it to lose it. Prioritize sleep, aiming for seven hours or more a night, which research shows helps lower stress. And make sure you're getting quality zzz's. If a snoring spouse or a fidgety cat wakes you up frequently throughout the night, you may end up getting the equivalent of just four hours of sleep, according to a study from [facility]. Keep pets out of the bedroom, and use a white-noise app to drown out snoring. 10. You Will Hit a plateau-And You Can Bust Through It As  you slim down, your body releases much less leptin, the fullness hormone.  If you're not strength training, start right now. Building muscle can raise your metabolism to help you overcome a plateau. To keep your body challenged and burning calories, incorporate new moves and more intense intervals into your workouts or add another sweat session to your weekly routine. Alternatively, cut an extra 100 calories or so a day from your diet. Now that you've lost weight, your body simply doesn't need as much fuel.    Since food equals calories, in order to lose weight you must either eat fewer calories, exercise more to burn off calories with activity, or both. Food that is not used to fuel the body is stored as fat. A major component of losing weight is to make smarter food choices. Here's how:  1)   Limit non-nutritious foods, such as: Sugar, honey, syrups and candy Pastries, donuts, pies, cakes and cookies Soft drinks, sweetened juices and alcoholic beverages  2)  Cut down on high-fat foods by: - Choosing poultry, fish or lean red meat - Choosing low-fat cooking methods, such as baking, broiling, steaming, grilling and boiling - Using low-fat or non-fat dairy products - Using vinaigrette, herbs, lemon or fat-free salad dressings - Avoiding fatty meats, such as bacon, sausage, franks, ribs and luncheon meats - Avoiding high-fat snacks like nuts, chips and chocolate - Avoiding fried foods - Using less butter, margarine, oil and mayonnaise - Avoiding high-fat gravies, cream sauces and cream-based soups  3) Eat a variety of foods, including: - Fruit and vegetables that are raw, steamed or baked - Whole grains, breads, cereal, rice and pasta - Dairy products, such as low-fat or non-fat milk or yogurt, low-fat cottage cheese and low-fat cheese - Protein-rich  foods like chicken, [place], fish, lean meat and legumes, or beans  4) Change your eating habits by: - Eat three balanced meals a day to help  control your hunger - Watch portion sizes and eat small servings of a variety of foods - Choose low-calorie snacks - Eat only when you are hungry and stop when you are satisfied - Eat slowly and try not to perform other tasks while eating - Find other activities to distract you from food, such as walking, taking up a hobby or being involved in the community - Include regular exercise in your daily routine ( minimum of 20 min of moderate-intensity exercise at least 5 days/week)  - Find a support group, if necessary, for emotional support in your weight loss journey           Easy ways to cut 100 calories   1. Eat your eggs with hot sauce OR salsa instead of cheese.  Eggs are great for breakfast, but many people consider eggs and cheese to be BFFs. Instead of cheese-1 oz. of cheddar has 114 calories-top your eggs with hot sauce, which contains no calories and helps with satiety and metabolism. Salsa is also a great option!!  2. Top your toast, waffles or pancakes with fresh berries instead of jelly or syrup. Half a cup of berries-fresh, frozen or thawed-has about 40 calories, compared with 2 tbsp. of maple syrup or jelly, which both have about 100 calories. The berries will also give you a good punch of fiber, which helps keep you full and satisfied and won't spike blood sugar quickly like the jelly or syrup. 3. Swap the non-fat latte for black coffee with a splash of half-and-half. Contrary to its name, that non-fat latte has 130 calories and a startling 19g of carbohydrates per 16 oz. serving. Replacing that 'light' drinkable dessert with a black coffee with a splash of half-and-half saves you more than 100 calories per 16 oz. serving. 4. Sprinkle salads with freeze-dried raspberries instead of dried cranberries. If you want a sweet addition to your nutritious salad, stay away from dried cranberries. They have a whopping 130 calories per  cup and 30g carbohydrates. Instead, sprinkle  freeze-dried raspberries guilt-free and save more than 100 calories per  cup serving, adding 3g of belly-filling fiber. 5. Go for mustard in place of mayo on your sandwich. Mustard can add really nice flavor to any sandwich, and there are tons of varieties, from spicy to honey. A serving of mayo is 95 calories, versus 10 calories in a serving of mustard.  Or try an avocado mayo spread: You can find the recipe few click this link: https://www.californiaavocado.com/recipes/recipe-container/[place]ifornia-avocado-mayo 6. Choose a DIY salad dressing instead of the store-bought kind. Mix Dijon or whole grain mustard with low-fat Kefir or red wine vinegar and garlic. 7. Use hummus as a spread instead of a dip. Use hummus as a spread on a high-fiber cracker or tortilla with a sandwich and save on calories without sacrificing taste. 8. Pick just one salad "accessory." Salad isn't automatically a calorie winner. It's easy to over-accessorize with toppings. Instead of topping your salad with nuts, avocado and cranberries (all three will clock in at 313 calories), just pick one. The next day, choose a different accessory, which will also keep your salad interesting. You don't wear all your jewelry every day, right? 9. Ditch the white pasta in favor of spaghetti squash. One cup of cooked spaghetti squash has about 40 calories, compared with traditional spaghetti, which comes  with more than 200. Spaghetti squash is also nutrient-dense. It's a good source of fiber and Vitamins A and C, and it can be eaten just like you would eat pasta-with a great tomato sauce and [place] meatballs or with pesto, tofu and spinach, for example. 10. Dress up your chili, soups and stews with non-fat [place] yogurt instead of sour cream. Just a 'dollop' of sour cream can set you back 115 calories and a whopping 12g of fat-seven of which are of the artery-clogging variety. Added bonus: [place] yogurt is packed with muscle-building protein,  calcium and B Vitamins. 11. Mash cauliflower instead of mashed potatoes. One cup of traditional mashed potatoes-in all their creamy goodness-has more than 200 calories, compared to mashed cauliflower, which you can typically eat for less than 100 calories per 1 cup serving. Cauliflower is a great source of the antioxidant indole-3-carbinol (I3C), which may help reduce the risk of some cancers, like breast cancer. 12. Ditch the ice cream sundae in favor of a [place] yogurt parfait. Instead of a cup of ice cream or fro-yo for dessert, try 1 cup of nonfat Greek yogurt topped with fresh berries and a sprinkle of cacao nibs. Both toppings are packed with antioxidants, which can help reduce cellular inflammation and oxidative damage. And the comparison is a no-brainer: One cup of ice cream has about 275 calories; one cup of frozen yogurt has about 230; and a cup of Greek yogurt has just 130, plus twice the protein, so you're less likely to return to the freezer for a second helping. 13. Put olive oil in a spray container instead of using it directly from the bottle. Each tablespoon of olive oil is 120 calories and 15g of fat. Use a mister instead of pouring it straight into the pan or onto a salad. This allows for portion control and will save you more than 100 calories. 14. When baking, substitute canned pumpkin for butter or oil. Canned pumpkin-not pumpkin pie mix-is loaded with Vitamin A, which is important for skin and eye health, as well as immunity. And the comparisons are pretty crazy:  cup of canned pumpkin has about 40 calories, compared to butter or oil, which has more than 800 calories. Yes, 800 calories. Applesauce and mashed banana can also serve as good substitutions for butter or oil, usually in a 1:1 ratio. 15. Top casseroles with high-fiber cereal instead of breadcrumbs. Breadcrumbs are typically made with white bread, while breakfast cereals contain 5-9g of fiber per serving. Not only will you  save more than 150 calories per  cup serving, the swap will also keep you more full and you'll get a metabolism boost from the added fiber. 16. Snack on pistachios instead of macadamia nuts. Believe it or not, you get the same amount of calories from 35 pistachios (100 calories) as you would from only five macadamia nuts. 17. Chow down on kale chips rather than potato chips. This is my favorite 'don't knock it 'till you try it' swap. Kale chips are so easy to make at home, and you can spice them up with a little grated parmesan or chili powder. Plus, they're a mere fraction of the calories of potato chips, but with the same crunch factor we crave so often. 18. Add seltzer and some fruit slices to your cocktail instead of soda or fruit juice. One cup of soda or fruit juice can pack on as much as 140 calories. Instead, use seltzer and fruit slices. The fruit provides valuable phytochemicals, such  as flavonoids and anthocyanins, which help to combat cancer and stave off the aging process.

## 2017-06-22 NOTE — Progress Notes (Signed)
Impression and Recommendations:    1. Morbid obesity (HCC)   2. Essential hypertension   3. Hyperlipidemia, unspecified hyperlipidemia type   4. Vitamin D deficiency   5. Chronic pain of right knee      Education and routine counseling performed. Handouts provided.  - Prescriptions refilled today. - Patient takes omeprazole daily OTC.  1. HTN - Stable at this time. - Was having some confusion about her medications during the shortage. - Continue on medications as prescribed. - Will continue to monitor.  2. Chronic Knee/Joint Pain - Noted that if her knee pain is very bad, she may consider consulting with an orthopedist. - In the past, she went to orthopedics and received a brace.  - Educated the patient that weight loss will help to alleviate the pains in her knees and joints, and in general.  3. Weight Management - Advised the patient to lose 1-2 lbs per week, 5 lbs per month.  - If she wishes to consult with a nutritionist, dietary specialist, or weight loss doctor, the patient knows these resources are available. - Encouraged the patient to take action, but noted that no changes can be made unless she wishes to change. - At this time, she is open to visiting a nutritionist or other specialist to get an idea of where to start. - Patient admits that she is not 100% ready to begin making this change.  - Referral for Nutrition - Advised the patient to call her insurance and see how much it will cost her for dietary counseling. - Patient desires nutrition counseling regarding weight loss, and prefers to stay in [place].  General Health Management - Recommended that the patient work toward exercising to improve health.    - PT may begin with 5 minutes of designated physical activity daily, slowly increasing to twice daily.  Recommended that the patient eventually strive for at least 150 minutes of cardio per week according to guidelines established by the [facility].   -  Recommended the free LoseIt App, or other methods to track her intake.  - Healthy dietary habits encouraged, including low-carb, low refined sugar, and high amounts of lean protein in diet.  - Avoid pre-packaged foods (crackers, chips, macaroni), seeking fresh, whole foods instead.  - Patient should also consume adequate amounts of water - half of body weight in oz of water per day.  - Advised the patient to start being kinder to herself and loving herself more, saying "I want to be better to myself, I want to be better to my body, I want my body to feel better."  BMI Review Explained to patient what BMI refers to, and what it means medically.    Told patient to think about it as a "medical risk stratification measurement" and how increasing BMI is associated with increasing risk/ or worsening state of various diseases such as hypertension, hyperlipidemia, diabetes, premature OA, depression etc.  American Heart Association guidelines for healthy diet, basically Mediterranean diet, and exercise guidelines of 30 minutes 5 days per week or more discussed in detail.  Health counseling performed.  All questions answered.  4. Follow-Up - 4-6 months, follow up for BP and chronic med issues. - Patient may return sooner, if desired, for weight loss support. - Next time patient comes in, she will be due for labs.   Orders Placed This Encounter  Procedures  . Amb ref to Medical Nutrition Therapy-MNT    Meds ordered this encounter  Medications  .  hydrochlorothiazide (HYDRODIURIL) 12.5 MG tablet    Sig: Take 1 tablet (12.5 mg total) by mouth daily.    Dispense:  90 tablet    Refill:  1  . losartan (COZAAR) 100 MG tablet    Sig: Take 1 tablet (100 mg total) by mouth daily.    Dispense:  90 tablet    Refill:  1    The patient was counseled, risk factors were discussed, anticipatory guidance given.  Gross side effects, risk and benefits, and alternatives of medications discussed with  patient.  Patient is aware that all medications have potential side effects and we are unable to predict every side effect or drug-drug interaction that may occur.  Expresses verbal understanding and consents to current therapy plan and treatment regimen.  Return in about 6 months (around 12/20/2017) for for BP, [place] med issues; sooner if desired for wt loss support.  Please see AVS handed out to patient at the end of our visit for further patient instructions/ counseling done pertaining to today's office visit.    Note: This document was prepared using Dragon voice recognition software and may include unintentional dictation errors.    This document serves as a record of services personally performed by [name], DO. It was created on her behalf by Peggye Fothergill, a trained medical scribe. The creation of this record is based on the scribe's personal observations and the provider's statements to them.   I have reviewed the above medical documentation for accuracy and completeness and I concur.  [name] 07/08/17 3:41 PM    Subjective:    HPI: Kristin Alexander is a 52 y.o. female who presents to [facility] today for follow up for HTN.    Overall, she notes that she's been alright.  Has had some car trouble, which has been stressful. Money issues are her main concern at this time.  Taking her vitamin D3 supplements as prescribed, both 5000 and 50,000.  Patient takes omeprazole daily over the counter.  Weight Notes that her weight has been "pretty good."  Has lost five lbs since 01/30/2017. Notes that she has not been trying to lose weight really, but has been trying to eat more vegetables, salads, and fruit.  At this time in her life, she believes she needs to help finding a way to get started when it comes to her diet. She knows she needs to lose weight and is amenable to visiting any consultations that insurance will cover. She  admits that, at this time, she is not 100% ready to make this change.  HTN:  Notes that her blood pressure has been running in 120's/60's regularly.  -  Her blood pressure has been controlled at home.   - Patient reports good compliance with blood pressure medications. Taking one whole tablet of losartan (cozaar) and one whole tablet of HCTZ daily.  - Denies medication S-E   - Smoking Status noted   - She denies new onset of: chest pain, exercise intolerance, shortness of breath, dizziness, visual changes, headache, lower extremity swelling or claudication.    Last 3 blood pressure readings in our office are as follows: BP Readings from Last 3 Encounters:  06/26/17 140/77  06/22/17 120/69  01/30/17 120/78    Pulse Readings from Last 3 Encounters:  06/26/17 96  06/22/17 74  01/30/17 94    Filed Weights   06/22/17 0956  Weight: 279 lb (126.6 kg)    Depression screen PHQ 2/9  06/26/2017 06/22/2017 01/30/2017 01/09/2017 11/23/2016  Decreased Interest 1 1 1  - 0  Down, Depressed, Hopeless 1 1 1 1  0  PHQ - 2 Score 2 2 2 1  0  Altered sleeping 1 1 1 1  -  Tired, decreased energy 1 1 2 2  -  Change in appetite 1 1 1 2  -  Feeling bad or failure about yourself  0 1 1 1  -  Trouble concentrating 0 0 1 0 -  Moving slowly or fidgety/restless 0 0 0 1 -  Suicidal thoughts 0 0 0 0 -  PHQ-9 Score 5 6 8 8  -  Difficult doing work/chores - - Somewhat difficult Somewhat difficult -     Patient Care Team    Relationship Specialty Notifications Start End  Thomasene Lotpalski, Cheyene Hamric, DO PCP - General Family Medicine  11/23/16   Salvatore MarvelWainer, Robert, MD Consulting Physician Orthopedic Surgery  11/23/16   Erick ColaceKirsteins, Andrew E, MD Consulting Physician Physical Medicine and Rehabilitation  11/23/16   Marcelle OverlieGrewal, Michelle, MD Consulting Physician Obstetrics and Gynecology  11/23/16      Lab Results  Component Value Date   CREATININE 0.61 12/12/2016   BUN 8 12/12/2016   NA 139 12/12/2016   K 4.9 12/12/2016   CL 102  12/12/2016   CO2 22 12/12/2016    Lab Results  Component Value Date   CHOL 203 (H) 12/12/2016   CHOL 207 (A) 10/27/2015    Lab Results  Component Value Date   HDL 41 12/12/2016   HDL 46 10/27/2015    Lab Results  Component Value Date   LDLCALC 127 (H) 12/12/2016   LDLCALC 131 10/27/2015    Lab Results  Component Value Date   TRIG 175 (H) 12/12/2016   TRIG 149 10/27/2015    Lab Results  Component Value Date   CHOLHDL 5.0 (H) 12/12/2016    No results found for: LDLDIRECT ===================================================================   Patient Active Problem List   Diagnosis Date Noted  . HLD (hyperlipidemia) 12/19/2016    Priority: High  . Hypertriglyceridemia 12/19/2016    Priority: High  . Hypertension 11/23/2016    Priority: High  . Morbid obesity (HCC) 11/12/2015    Priority: High  . OA (osteoarthritis)- b/l knees, some back 08/13/2015    Priority: Medium  . Chronic pain syndrome 11/12/2015    Priority: Low  . Vitamin D deficiency 01/30/2017  . Chronic pain of right knee 01/09/2017  . Patellofemoral pain syndrome of right knee 01/09/2017  . Heart murmur 11/23/2016  . Abnormality of gait 11/12/2015     Past Medical History:  Diagnosis Date  . Arthritis   . Heart murmur   . Hypertension   . OA (osteoarthritis) 08/13/2015     Past Surgical History:  Procedure Laterality Date  . CHOLECYSTECTOMY  2005  . DILATION AND CURETTAGE OF UTERUS  2010     Family History  Problem Relation Age of Onset  . Hypertension Mother   . Diabetes Mother   . COPD Mother   . Heart disease Mother   . Diabetes Father   . Stroke Father   . Heart disease Father   . Heart attack Sister   . Lupus Sister      Social History   Substance and Sexual Activity  Drug Use No  ,  Social History   Substance and Sexual Activity  Alcohol Use No  . Alcohol/week: 0.0 oz  ,  Social History   Tobacco Use  Smoking Status Never Smoker  Smokeless Tobacco  Never Used  ,    Current Outpatient Medications on File Prior to Visit  Medication Sig Dispense Refill  . Cholecalciferol (VITAMIN D3) 5000 units TABS 5,000 IU OTC vitamin D3 daily. 90 tablet 3  . norethindrone-ethinyl estradiol (JUNEL FE,GILDESS FE,LOESTRIN FE) 1-20 MG-MCG tablet Take 1 tablet by mouth daily.    Marland Kitchen omeprazole (PRILOSEC) 20 MG capsule Take 20 mg by mouth daily.    . Vitamin D, Ergocalciferol, (DRISDOL) 50000 units CAPS capsule Take 1 capsule (50,000 Units total) by mouth every 7 (seven) days. 12 capsule 10   Current Facility-Administered Medications on File Prior to Visit  Medication Dose Route Frequency Provider Last Rate Last Dose  . bupivacaine (MARCAINE) 0.5 % (with pres) injection 2 mL  2 mL Other Once Lari Linson, DO      . lidocaine (XYLOCAINE) 2 % injection 1 mL  1 mL Other Once Geoff Dacanay, DO      . methylPREDNISolone acetate (DEPO-MEDROL) injection 40 mg  40 mg Other Once [name], DO         Allergies  Allergen Reactions  . Codeine Nausea Only  . Penicillins Other (See Comments)     Review of Systems:   General:  Denies fever, chills Optho/Auditory:   Denies visual changes, blurred vision Respiratory:   Denies SOB, cough, wheeze, DIB  Cardiovascular:   Denies chest pain, palpitations, painful respirations Gastrointestinal:   Denies nausea, vomiting, diarrhea.  Endocrine:     Denies new hot or cold intolerance Musculoskeletal:  Denies joint swelling, gait issues, or new unexplained myalgias/ arthralgias Skin:  Denies rash, suspicious lesions  Neurological:    Denies dizziness, unexplained weakness, numbness  Psychiatric/Behavioral:   Denies mood changes  Objective:    Blood pressure 120/69, pulse 74, height 5' 2.01" (1.575 m), weight 279 lb (126.6 kg).  Body mass index is 51.02 kg/m.  General: Well Developed, well nourished, and in no acute distress.  HEENT: Normocephalic, atraumatic, pupils equal round reactive to light, neck  supple, No carotid bruits, no JVD Skin: Warm and dry, cap RF less 2 sec Cardiac: Regular rate and rhythm, S1, S2 WNL's, no murmurs rubs or gallops Respiratory: ECTA B/L, Not using accessory muscles, speaking in full sentences. NeuroM-Sk: Ambulates w/o assistance, moves ext * 4 w/o difficulty, sensation grossly intact.  Ext: scant edema b/l lower ext Psych: No HI/SI, judgement and insight good, Euthymic mood. Full Affect.

## 2017-06-26 ENCOUNTER — Ambulatory Visit: Payer: BLUE CROSS/BLUE SHIELD | Admitting: Family Medicine

## 2017-06-26 ENCOUNTER — Encounter: Payer: Self-pay | Admitting: Family Medicine

## 2017-06-26 VITALS — BP 140/77 | HR 96 | Temp 98.6°F | Ht 62.0 in | Wt 275.0 lb

## 2017-06-26 DIAGNOSIS — J069 Acute upper respiratory infection, unspecified: Secondary | ICD-10-CM | POA: Diagnosis not present

## 2017-06-26 DIAGNOSIS — J029 Acute pharyngitis, unspecified: Secondary | ICD-10-CM

## 2017-06-26 DIAGNOSIS — R52 Pain, unspecified: Secondary | ICD-10-CM | POA: Diagnosis not present

## 2017-06-26 LAB — POCT INFLUENZA A/B
INFLUENZA A, POC: NEGATIVE
INFLUENZA B, POC: NEGATIVE

## 2017-06-26 LAB — POCT RAPID STREP A (OFFICE): Rapid Strep A Screen: NEGATIVE

## 2017-06-26 NOTE — Patient Instructions (Signed)
 Symptoms for a upper respiratory tract infection usually last 3-7 days but can stretch out to 2-3 weeks before you're feeling back to normal.  Your symptoms should not worsen after 7-10 days and if they do, please notify our office, as you may need additional evaluation.  You can use over-the-counter afrin nasal spray for up to 3 days (NO longer than that) which will help acutely with nasal drainage/ congestion short term.   Also, sterile saline nasal rinses, such as Neil med or AYR sinus rinses, can be very helpful and should be done twice daily- especially throughout the allergy season.   Remember you should use distilled water or previously boiled water to do this.  Then you may use over-the-counter Flonase 1 spray each nostril twice daily after sinus rinses.   You can also use an over the counter cold and flu medication such as Tylenol Severe Cold and Sinus/Flu or Dayquil, Nyquil and the like, which will help with cough, congestion, headache/ pain, fevers/chills etc.  Please note, if you being treated for hypertension or have high blood pressure, you should be using the cold meds designated "HBP".    Wash your hands frequently, as you did not want to get those around you sick as well. Never sneeze or cough on others.  And you should not be going to school or work if you are running a temperature of 100.5 or more on two separate occasions.   Drink plenty of fluids and stay hydrated, especially if you are running fevers.  We don't know why, but chicken soup also helps, try it! :)      Upper respiratory viral infection, Adult   Adenoviruses are common viruses that cause many different types of infections. The viruses usually affect the lungs, but they can also affect other parts of the body, including the eyes, stomach, bowels, bladder, and brain. The most common type of adenovirus infection is the common cold. Usually, adenovirus infections are not severe unless you have another health  problem that makes it hard for your body to fight off infection. What are the causes? You can get this condition if you:  Touch a surface or object that has an adenovirus on it and then touch your mouth, nose, or eyes with unwashed hands.  Come into close physical contact with an infected person, such as by hugging or shaking hands.  Breathe in droplets that fly through the air when an infected person talks, coughs, or sneezes.  Have contact with infected stool.  Swim in a pool that does not have enough chlorine.  Adenoviruses can live outside the body for many weeks. They spread easily from person to person (are contagious). What increases the risk? This condition is more likely to develop in:  People who spend a lot of time in places where there are many people, such as schools, summer camps, daycare centers, community centers, and military recruit training centers.  Elderly adults.  People with a weak body defense system (immune system).  People with a lung disease.  People with a heart condition.     What are the signs or symptoms? Adenovirus infections usually cause flu-like symptoms. Once the virus gets into the body, symptoms of this condition can take up to 14 days to develop. Symptoms may include:  Headache.  Stiff neck.  Sleepiness or fatigue.  Confusion or disorientation.  Fever.  Sore throat.  Cough.  Trouble breathing.  Runny nose or congestion.  Pink eye (conjunctivitis).  Bleeding into   the covering of the eye.  Stomachache or diarrhea.  Nausea or vomiting.  Blood in the urine or pain while urinating.  Ear pain or fullness.  How is this diagnosed? This condition may be diagnosed based on your symptoms and a physical exam. Your health care provider may order tests to make sure your symptoms are not caused by another type of problem. Tests can include:  Blood tests.  Urine tests.  Stool tests.  Chest X-ray.  Tissue or throat  culture.  How is this treated? This condition goes away on its own with time. Treatment for this condition involves managing symptoms until the condition goes away. Your health care provider may recommend:  Rest.  Drinking more fluids.  Taking over-the-counter medicine to help relieve a sore throat, fever, or headache.  Follow these instructions at home:  Rest at home until your symptoms go away.  Drink enough fluid to keep your urine clear or pale yellow.  Take over-the-counter and prescription medicines only as told by your health care provider.  Keep all follow-up visits as told by your health care provider. This is important. How is this prevented? Adenoviruses are resistant to many cleaning products and can remain on surfaces for long periods of time. To help prevent infection:  Wash your hands often with soap and water.  Cover your nose or mouth when you sneeze or cough.  Do not touch your eyes, nose, or mouth with unwashed hands.  Clean commonly used objects often.  Do not swim in a pool that is not properly chlorinated.  Avoid close contact with people who are sick.  Do not go to school or work when you are sick.  Contact a health care provider if:  Your symptoms do not improve after 10 days.  Your symptoms get worse.  You cannot eat or drink without vomiting. Get help right away if:  You have trouble breathing or you are breathing rapidly.  Your skin, lips, or fingernails look blue (cyanosis).  You have a rapid heart rate.  You become confused.  You lose consciousness. This information is not intended to replace advice given to you by your health care provider. Make sure you discuss any questions you have with your health care provider. Document Released: 07/09/2002 Document Revised: 12/14/2015 Document Reviewed: 12/14/2015 Elsevier Interactive Patient Education  2018 [employer]   

## 2017-06-26 NOTE — Progress Notes (Signed)
Acute Care Office visit  Assessment and plan:  1. Viral URI   2. Body aches   3. Sore throat    1. Viral URI- Viral vs Allergic vs Bacterial causes for pt's symptoms reveiwed.    - Supportive care and various OTC medications discussed in addition to any prescribed. - Call or RTC if new symptoms, or if no improvement or worse over next several days.   - Will consider ABX if sx continue past 10 days and worsening if not already given.    2. Body aches- take OTC medications for symptom relief.  -Eat a BRAT diet. Do not eat citrus, dairy, or spicy foods that may upset your stomach. Handouts and information provided.  3. Sore throat- As strep and flu are both negative in office, this is most likely of viral etiology. - No orders of the defined types were placed in this encounter.   Orders Placed This Encounter  Procedures  . POCT Influenza A/B  . POCT rapid strep A    Gross side effects, risk and benefits, and alternatives of medications discussed with patient.  Patient is aware that all medications have potential side effects and we are unable to predict every sideeffect or drug-drug interaction that may occur.  Expresses verbal understanding and consents to current therapy plan and treatment regiment.   Education and routine counseling performed. Handouts provided.  Anticipatory guidance and routine counseling done re: condition, txmnt options and need for follow up. All questions of patient's were answered.  Return if symptoms worsen or fail to improve, for In addition follow-up for your chronic care as previously discussed.  Please see AVS handed out to patient at the end of our visit for additional patient instructions/ counseling done pertaining to today's office visit.  Note: This document was partially repared using Dragon voice recognition software and may include unintentional dictation errors.  This document serves as a record of services personally performed by  Thomasene Loteborah Liyanna Cartwright, DO. It was created on her behalf by Thelma BargeNick Cochran, a trained medical scribe. The creation of this record is based on the scribe's personal observations and the provider's statements to them.   I have reviewed the above medical documentation for accuracy and completeness and I concur.  Thomasene LotDeborah Quiana Cobaugh 06/26/17 5:39 PM   Subjective:    Chief Complaint  Patient presents with  . Sore Throat    sore throat, productive cough, fever, bodyaches, rt earache x 2 days    HPI:  Pt presents with Sx for 2 days.   C/o: She states she caught a little bit of a stomach bug which included abdominal pain and diarrhea. Yesterday morning, she complains of sore throat, cough, body aches, fever (100.9 orally, latest this morning), hurts when she coughs, and back pain.    For symptoms patient has tried:  Advil. She has not taken anything for her other symptoms.   Overall getting:   worse  She has been around people with strep. She has been eating bland foods.   Patient Care Team    Relationship Specialty Notifications Start End  Thomasene Lotpalski, Vanesha Athens, DO PCP - General Family Medicine  11/23/16   Salvatore MarvelWainer, Robert, MD Consulting Physician Orthopedic Surgery  11/23/16   Erick ColaceKirsteins, Andrew E, MD Consulting Physician Physical Medicine and Rehabilitation  11/23/16   Marcelle OverlieGrewal, Michelle, MD Consulting Physician Obstetrics and Gynecology  11/23/16     Past medical history, Surgical history, Family history reviewed and noted below, Social history, Allergies, and Medications have  been entered into the medical record, reviewed and changed as needed.   Allergies  Allergen Reactions  . Codeine Nausea Only  . Penicillins Other (See Comments)    Review of Systems: - see above HPI for pertinent positives General:   No C, wt loss Pulm:   No DIB, pleuritic chest pain Card:  No CP, palpitations Abd:  No n/v/d or pain Ext:  No inc edema from baseline   Objective:   Blood pressure 140/77, pulse 96,  temperature 98.6 F (37 C), height 5\' 2"  (1.575 m), weight 275 lb (124.7 kg), SpO2 97 %. Body mass index is 50.3 kg/m. General: Well Developed, well nourished, appropriate for stated age.  Neuro: Alert and oriented x3, extra-ocular muscles intact, sensation grossly intact.  HEENT: Normocephalic, atraumatic, pupils equal round reactive to light, neck supple, no masses, no painful lymphadenopathy, TM's intact B/L, no acute findings. Nares- patent, clear d/c, OP- clear, mild erythema, No TTP sinuses Skin: Warm and dry, no gross rash. Cardiac: RRR, S1 S2,  no murmurs rubs or gallops.  Respiratory: ECTA B/L and A/P, Not using accessory muscles, speaking in full sentences- unlabored. Vascular:  No gross lower ext edema, cap RF less 2 sec. Psych: No HI/SI, judgement and insight good, Euthymic mood. Full Affect.

## 2017-09-11 ENCOUNTER — Other Ambulatory Visit: Payer: Self-pay | Admitting: Obstetrics and Gynecology

## 2017-09-11 DIAGNOSIS — R928 Other abnormal and inconclusive findings on diagnostic imaging of breast: Secondary | ICD-10-CM

## 2017-09-18 ENCOUNTER — Ambulatory Visit
Admission: RE | Admit: 2017-09-18 | Discharge: 2017-09-18 | Disposition: A | Discharge status: Home / Self Care | Payer: BLUE CROSS/BLUE SHIELD | Source: Ambulatory Visit | Attending: Obstetrics and Gynecology | Admitting: Obstetrics and Gynecology

## 2017-09-18 ENCOUNTER — Other Ambulatory Visit: Payer: BLUE CROSS/BLUE SHIELD

## 2017-09-18 DIAGNOSIS — R928 Other abnormal and inconclusive findings on diagnostic imaging of breast: Secondary | ICD-10-CM

## 2017-12-04 ENCOUNTER — Encounter: Payer: Self-pay | Admitting: Family Medicine

## 2017-12-04 ENCOUNTER — Ambulatory Visit: Payer: BLUE CROSS/BLUE SHIELD

## 2017-12-04 ENCOUNTER — Other Ambulatory Visit: Payer: BLUE CROSS/BLUE SHIELD

## 2017-12-04 ENCOUNTER — Ambulatory Visit: Payer: BLUE CROSS/BLUE SHIELD | Admitting: Family Medicine

## 2017-12-04 ENCOUNTER — Other Ambulatory Visit: Payer: Self-pay

## 2017-12-04 VITALS — BP 107/72 | HR 81 | Ht 62.0 in | Wt 252.6 lb

## 2017-12-04 DIAGNOSIS — E781 Pure hyperglyceridemia: Secondary | ICD-10-CM

## 2017-12-04 DIAGNOSIS — Z0000 Encounter for general adult medical examination without abnormal findings: Secondary | ICD-10-CM

## 2017-12-04 DIAGNOSIS — K219 Gastro-esophageal reflux disease without esophagitis: Secondary | ICD-10-CM

## 2017-12-04 DIAGNOSIS — E559 Vitamin D deficiency, unspecified: Secondary | ICD-10-CM

## 2017-12-04 DIAGNOSIS — I10 Essential (primary) hypertension: Secondary | ICD-10-CM

## 2017-12-04 DIAGNOSIS — E785 Hyperlipidemia, unspecified: Secondary | ICD-10-CM

## 2017-12-04 DIAGNOSIS — M79671 Pain in right foot: Secondary | ICD-10-CM | POA: Insufficient documentation

## 2017-12-04 MED ORDER — HYDROCHLOROTHIAZIDE 12.5 MG PO TABS: 6.2500 mg | ORAL_TABLET | Freq: Every day | ORAL | 1 refills | Status: AC

## 2017-12-04 MED ORDER — LOSARTAN POTASSIUM 100 MG PO TABS: 50.0000 mg | ORAL_TABLET | Freq: Every day | ORAL | 1 refills | Status: AC

## 2017-12-04 NOTE — Patient Instructions (Addendum)

## 2017-12-04 NOTE — Progress Notes (Signed)
Assessment and Plan:  1. chronic Right foot pain   2. Essential hypertension   3. Morbid obesity (HCC)   4. Hyperlipidemia, unspecified hyperlipidemia type   5. Hypertriglyceridemia   6. Gastroesophageal reflux disease, esophagitis presence not specified      Education and routine counseling performed. Handouts provided.  Orders Placed This Encounter  Procedures  . DG Foot Complete Right  . Ambulatory referral to Podiatry    Meds ordered this encounter  Medications  . hydrochlorothiazide (HYDRODIURIL) 12.5 MG tablet    Sig: Take 0.5 tablets (6.25 mg total) by mouth daily.    Dispense:  90 tablet    Refill:  1  . losartan (COZAAR) 100 MG tablet    Sig: Take 0.5 tablets (50 mg total) by mouth daily.    Dispense:  90 tablet    Refill:  1     Medications Discontinued During This Encounter  Medication Reason  . norethindrone-ethinyl estradiol (JUNEL FE,GILDESS FE,LOESTRIN FE) 1-20 MG-MCG tablet Change in therapy  . bupivacaine (MARCAINE) 0.5 % (with pres) injection 2 mL   . lidocaine (XYLOCAINE) 2 % injection 1 mL   . methylPREDNISolone acetate (DEPO-MEDROL) injection 40 mg   . hydrochlorothiazide (HYDRODIURIL) 12.5 MG tablet   . losartan (COZAAR) 100 MG tablet      - Hypertension:  -Reduced blood pressure medications; see med list below -Encouraged patient to call if she experiences adverse symptoms -Discussed red flag symptoms and to go to the ER if experienced -Importance of ambulatory blood pressure monitoring d/c pt- bring in log next OV -DASH diet.  -Routine exercise- 150 min /wk   - Cholesterol:  -PT had a lipid panel today; will follow up once results are in -Encouraged patient to continue on current medication regimen    - Vitamin D:   -monitor level and medications/ supplements as indicated.  -PT had a blood panel today; will follow up once results are in   - Weight Mgt: -Patient declined pharmaceutical options to supplement weight loss,  stating "I want to keep doing what I have been because I feel like its really working" -Encouraged patient to continue losing weight for improvement in overall health  -Explained to patient what BMI refers to, and what it means medically.   Told patient to think about it as a "medical risk stratification measurement" and how increasing BMI is associated with increasing risk/ or worsening state of various diseases such as hypertension, hyperlipidemia, diabetes, premature OA, depression etc.  -American Heart Association guidelines for healthy diet, basically Mediterranean diet, and exercise guidelines of 30 minutes 5 days per week or more discussed in detail.  -Reminded patient the need for yearly complete physical exam office visits in addition to office visits for management of the chronic diseases  Foot Pain -Probable calcaneal bone spur; Directly visualized and independently interpreted x rays which further influenced my plan of care. -Counseled patient on possibility of plantar fascitis and ways to help with issue -Gave patient possible remedies for pain, including icing, stretching feet, and OTC pain medications that are compatible with her medical regimen -Discussed referral to podiatry for further evaluation and treatment -Explained the necessity of proper support for her feet especially with increasing her level of activity -Encouraged patient to continue losing weight in order to improve joint pain  Knee Pain -Encouraged patient to continue with weight loss in order in improve knee pain -Emphasized importance of foot support in alleviating knee pain  -Patient states she  does not need additional medication for knee pain  GERD -Encouraged patient to continue taking medication; see med list below -Notified patient to contact us with any medical issues    -Gross side effects, risk and benefits, and alternatives of medications discussed with patient.  Patient is aware that all  medications have potential side effects and we are unable to predict every side effect or drug-drug interaction that may occur.  Expresses verbal understanding and consents to current therapy plan and treatment regimen.    Follow Up:  Return we halved doses of BP meds- f/up 37mo or sooner if prob, for 37mo f/up - goal is to cont to be dedicated to [employer].   Please see AVS handed out to patient at the end of our visit for further patient instructions/ counseling done pertaining to today's office visit.    Note:  This document was prepared using Dragon voice recognition software and may include unintentional dictation errors.  This document serves as a record of services personally performed by [name], MD. It was created on her behalf by Alphonse Guild, a trained medical scribe. The creation of this record is based on the scribe's personal observations and the provider's statements to them.   I have reviewed the above medical documentation for accuracy and completeness and I concur.  [name] 12/06/17 5:03 PM     ----------------------------------------------------------------------------------------------------------------------  Subjective:  HPI: Kristin Alexander 52 y.o. female  presents for 3 month follow up for multiple medical problems.   HPI:  Kristin Alexander y.o. female presents for 3 month follow up for multiple medical problems.   Blood sugar -A1C is at goal  Lab Results  Component Value Date   HGBA1C 5.1 12/04/2017   HGBA1C 5.1 12/12/2016    Lab Results  Component Value Date   LDLCALC 121 (H) 12/04/2017   CREATININE 0.72 12/04/2017    Hyperlipidemia  Pt reports compliance with medications and/ or treatment plan Denies S-E  Patient reports poor compliance with low fat/low cholesterol diet and healthier lifestyle  modifications.   Last Blood testing on 11/2017 LDL 127  Triglycerides 175  Last lipid panel as follows:  Lab Results  Component  Value Date   CHOL 185 12/04/2017   HDL 37 (L) 12/04/2017   LDLCALC 121 (H) 12/04/2017   TRIG 134 12/04/2017   CHOLHDL 5.0 (H) 12/04/2017         Hypertension  Pt reports compliance with medications and/ or treatment plan Denies S-E. Home Blood pressure range mirrors today's BP Patient denies new onset of sx- no chest pain, dizziness, HA, DIB/ shortness of breath or swelling.   Lab Results  Component Value Date   CREATININE 0.72 12/04/2017    Last 3 blood pressure readings in our office are as follows: BP Readings from Last 3 Encounters:  12/04/17 107/72  06/26/17 140/77  06/22/17 120/69    GERD -Patient states she has been following her medical regimen and has experienced no symptoms   Weight:  -Patient has lost 40 pounds since February and states she feels like she "can finally adjust to the lifestyle" so she has been able to stick to her change -States she started Weight Watchers in June and that's where she lost the last 25 lbs.  Wt Readings from Last 3 Encounters:  12/04/17 252 lb 9.6 oz (114.6 kg)  06/26/17 275 lb (124.7 kg)  06/22/17 279 lb (126.6 kg)   BMI Readings from Last 3 Encounters:  12/04/17 46.20 kg/m  06/26/17 50.30 kg/m  06/22/17 51.02 kg/m    Breast -States she had a lump on her right breast -Patient had an ultrasound in May 2019 showing the tissue was likely benign, normal tissue   Foot Pain -Has experienced right foot pain and some swelling with onset in May and gradual increase over time -Pain located in the top, lateral portion of foot and also in the ball of the foot -States the pain is "excruciating" now -Exacerbates pain: cold, flexing foot, extending foot, sandals, standing up after sitting for long periods -States pain is worst in the morning when she first wakes up  -Has some reservations about visiting podiatry due to insurance coverage  Knee Pain -States she has noticed improvement in her knee pain with her weight  loss -Believes her gait has also improved with weight loss, which has allowed her knees to have less stress    Patient Care Team    Relationship Specialty Notifications Start End  [name], DO PCP - General Family Medicine  11/23/16   Salvatore Marvel, MD Consulting Physician Orthopedic Surgery  11/23/16   Erick Colace, MD Consulting Physician Physical Medicine and Rehabilitation  11/23/16   Marcelle Overlie, MD Consulting Physician Obstetrics and Gynecology  11/23/16      Review of Systems: General:   Denies fever, chills, unexplained weight loss.  Optho/Auditory:   Denies visual changes, blurred vision/LOV Respiratory:   Denies SOB, DOE more than baseline levels.   Cardiovascular:   Denies chest pain, palpitations, new onset peripheral edema  Gastrointestinal:   Denies nausea, vomiting, diarrhea.  Genitourinary: Denies dysuria, freq/ urgency, flank pain or discharge from genitals.  Endocrine:     Denies hot or cold intolerance, polyuria, polydipsia. Musculoskeletal:   Denies unexplained myalgias, joint swelling, unexplained arthralgias, gait problems.  Skin:  Denies rash, suspicious lesions Neurological:     Denies dizziness, unexplained weakness, numbness  Psychiatric/Behavioral:   Denies mood changes, suicidal or homicidal ideations, hallucinations    Objective: Physical Exam: BP 107/72   Pulse 81   Ht 5\' 2"  (1.575 m)   Wt 252 lb 9.6 oz (114.6 kg)   SpO2 98%   BMI 46.20 kg/m  Body mass index is 46.2 kg/m. General: Well nourished, in no apparent distress. Eyes: PERRLA, EOMs, conjunctiva clr no swelling or erythema ENT/Mouth: Hearing appears normal.  Mucus Membranes Moist  Neck: Supple, no masses, no carotid bruits b/l Resp: Respiratory effort- normal, ECTA B/L w/o W/R/R  Cardio: RRR w/o MRGs. Abdomen: no gross distention. Lymphatics:  Brisk peripheral pulses, less 2 sec cap RF, no gross edema  M-sk: Full ROM, 5/5 strength, normal gait.  Skin: Warm, dry  without rashes, lesions, ecchymosis.  Neuro: Alert, Oriented Psych: Normal affect, Insight and Judgment appropriate.    Current Medications:  Current Outpatient Medications on File Prior to Visit  Medication Sig Dispense Refill  . Cholecalciferol (VITAMIN D3) 5000 units TABS 5,000 IU OTC vitamin D3 daily. 90 tablet 3  . MICROGESTIN 1-20 MG-MCG tablet Take 1 tablet by mouth daily.  2  . omeprazole (PRILOSEC) 20 MG capsule Take 20 mg by mouth daily.    . Vitamin D, Ergocalciferol, (DRISDOL) 50000 units CAPS capsule Take 1 capsule (50,000 Units total) by mouth every 7 (seven) days. 12 capsule 10   No current facility-administered medications on file prior to visit.     Medical History:  Patient Active Problem List   Diagnosis Date Noted  . HLD (hyperlipidemia) 12/19/2016    Priority: High  .  Hypertriglyceridemia 12/19/2016    Priority: High  . Hypertension 11/23/2016    Priority: High  . Morbid obesity (HCC) 11/12/2015    Priority: High  . OA (osteoarthritis)- b/l knees, some back 08/13/2015    Priority: Medium  . Chronic pain syndrome 11/12/2015    Priority: Low  . Gastroesophageal reflux disease 12/04/2017  . chronic Right foot pain 12/04/2017  . Vitamin D deficiency 01/30/2017  . Chronic pain of right knee 01/09/2017  . Patellofemoral pain syndrome of right knee 01/09/2017  . Heart murmur 11/23/2016  . Abnormality of gait 11/12/2015    Allergies:  Allergies  Allergen Reactions  . Codeine Nausea Only  . Penicillins Other (See Comments)     Family history-  Reviewed; changed as appropriate  Social history-  Reviewed; changed as appropriate

## 2017-12-05 LAB — LIPID PANEL
CHOLESTEROL TOTAL: 185 mg/dL (ref 100–199)
Chol/HDL Ratio: 5 ratio — ABNORMAL HIGH (ref 0.0–4.4)
HDL: 37 mg/dL — ABNORMAL LOW (ref >39)
LDL Calculated: 121 mg/dL — ABNORMAL HIGH (ref 0–99)
TRIGLYCERIDES: 134 mg/dL (ref 0–149)
VLDL Cholesterol Cal: 27 mg/dL (ref 5–40)

## 2017-12-05 LAB — CBC WITH DIFFERENTIAL/PLATELET
Basophils Absolute: 0 10*3/uL (ref 0.0–0.2)
Basos: 0 %
EOS (ABSOLUTE): 0.1 10*3/uL (ref 0.0–0.4)
EOS: 1 %
HEMATOCRIT: 38.5 % (ref 34.0–46.6)
HEMOGLOBIN: 13.2 g/dL (ref 11.1–15.9)
IMMATURE GRANS (ABS): 0 10*3/uL (ref 0.0–0.1)
Immature Granulocytes: 0 %
LYMPHS: 37 %
Lymphocytes Absolute: 2.8 10*3/uL (ref 0.7–3.1)
MCH: 29.7 pg (ref 26.6–33.0)
MCHC: 34.3 g/dL (ref 31.5–35.7)
MCV: 87 fL (ref 79–97)
MONOCYTES: 6 %
Monocytes Absolute: 0.5 10*3/uL (ref 0.1–0.9)
NEUTROS ABS: 4.2 10*3/uL (ref 1.4–7.0)
Neutrophils: 56 %
Platelets: 261 10*3/uL (ref 150–450)
RBC: 4.45 x10E6/uL (ref 3.77–5.28)
RDW: 13.9 % (ref 12.3–15.4)
WBC: 7.5 10*3/uL (ref 3.4–10.8)

## 2017-12-05 LAB — COMPREHENSIVE METABOLIC PANEL
ALK PHOS: 68 IU/L (ref 39–117)
ALT: 17 IU/L (ref 0–32)
AST: 15 IU/L (ref 0–40)
Albumin/Globulin Ratio: 1.5 (ref 1.2–2.2)
Albumin: 4.1 g/dL (ref 3.5–5.5)
BUN/Creatinine Ratio: 15 (ref 9–23)
BUN: 11 mg/dL (ref 6–24)
Bilirubin Total: 0.3 mg/dL (ref 0.0–1.2)
CO2: 22 mmol/L (ref 20–29)
CREATININE: 0.72 mg/dL (ref 0.57–1.00)
Calcium: 8.9 mg/dL (ref 8.7–10.2)
Chloride: 102 mmol/L (ref 96–106)
GFR calc Af Amer: 111 mL/min/{1.73_m2} (ref >59)
GFR calc non Af Amer: 96 mL/min/{1.73_m2} (ref >59)
GLUCOSE: 87 mg/dL (ref 65–99)
Globulin, Total: 2.7 g/dL (ref 1.5–4.5)
Potassium: 3.6 mmol/L (ref 3.5–5.2)
Sodium: 140 mmol/L (ref 134–144)
TOTAL PROTEIN: 6.8 g/dL (ref 6.0–8.5)

## 2017-12-05 LAB — VITAMIN D 25 HYDROXY (VIT D DEFICIENCY, FRACTURES): VIT D 25 HYDROXY: 24.9 ng/mL — AB (ref 30.0–100.0)

## 2017-12-05 LAB — TSH: TSH: 3.05 u[IU]/mL (ref 0.450–4.500)

## 2017-12-05 LAB — HEMOGLOBIN A1C
Est. average glucose Bld gHb Est-mCnc: 100 mg/dL
Hgb A1c MFr Bld: 5.1 % (ref 4.8–5.6)

## 2017-12-05 LAB — T4, FREE: FREE T4: 1.33 ng/dL (ref 0.82–1.77)

## 2017-12-11 ENCOUNTER — Encounter: Payer: Self-pay | Admitting: Family Medicine

## 2017-12-20 ENCOUNTER — Ambulatory Visit: Payer: BLUE CROSS/BLUE SHIELD | Admitting: Family Medicine

## 2018-04-26 ENCOUNTER — Ambulatory Visit: Payer: BLUE CROSS/BLUE SHIELD | Admitting: Family Medicine

## 2018-04-26 ENCOUNTER — Encounter: Payer: Self-pay | Admitting: Family Medicine

## 2018-04-26 VITALS — BP 112/73 | HR 68 | Temp 98.4°F | Ht 62.0 in | Wt 222.2 lb

## 2018-04-26 DIAGNOSIS — E781 Pure hyperglyceridemia: Secondary | ICD-10-CM

## 2018-04-26 DIAGNOSIS — E559 Vitamin D deficiency, unspecified: Secondary | ICD-10-CM

## 2018-04-26 DIAGNOSIS — Z713 Dietary counseling and surveillance: Secondary | ICD-10-CM | POA: Insufficient documentation

## 2018-04-26 DIAGNOSIS — I10 Essential (primary) hypertension: Secondary | ICD-10-CM

## 2018-04-26 DIAGNOSIS — E785 Hyperlipidemia, unspecified: Secondary | ICD-10-CM

## 2018-04-26 NOTE — Progress Notes (Signed)
Impression and Recommendations:    1. Hypertension, unspecified type   2. Morbid obesity (HCC)   3. Weight loss counseling   4. Hypertriglyceridemia   5. Hyperlipidemia, unspecified hyperlipidemia type   6. Vitamin D deficiency     - Patient is transitioning insurance and therefore switching providers come January.  1. Hypertension - Sx Controlled - BP remains controlled at this time. - Continue treatment plan as prescribed.  See med list below. - Patient tolerating meds well without complication.  Denies S-E  - Lifestyle changes such as dash diet and engaging in a regular exercise program discussed with patient.  Educational handouts provided  - Ambulatory BP monitoring encouraged. Keep log and bring in next OV.   2. BMI Counseling - Morbid Obesity, BMI of 40.64; (Weight Loss down from 52.72 in 11/2016) Explained to patient what BMI refers to, and what it means medically.    Told patient to think about it as a "medical risk stratification measurement" and how increasing BMI is associated with increasing risk/ or worsening state of various diseases such as hypertension, hyperlipidemia, diabetes, premature OA, depression etc.  - Advised patient to keep up the amazing work on her weight loss. - Discussed that patient should ideally aim for a goal BMI under 30.  - Encouraged patient to begin exercises to help tone her muscles.  American Heart Association guidelines for healthy diet, basically Mediterranean diet, and exercise guidelines of 30 minutes 5 days per week or more discussed in detail.  Health counseling performed.  All questions answered.   3. Lifestyle & Preventative Health Maintenance - Advised patient to continue working toward exercising to improve overall mental, physical, and emotional health.    - Reviewed the "spokes of the wheel" of mood and health management.  Stressed the importance of ongoing prudent habits, including regular exercise, appropriate sleep  hygiene, healthful dietary habits, and prayer/meditation to relax.  - Encouraged patient to engage in daily physical activity, especially a formal exercise routine.  Recommended that the patient eventually strive for at least 150 minutes of moderate cardiovascular activity per week according to guidelines established by the [facility].   - Healthy dietary habits encouraged, including low-carb, and high amounts of lean protein in diet.   - Patient should also consume adequate amounts of water.   Education and routine counseling performed. Handouts provided.   Orders Placed This Encounter  Procedures  . Lipid panel  . VITAMIN D 25 Hydroxy (Vit-D Deficiency, Fractures)    The patient was counseled, risk factors were discussed, anticipatory guidance given.  Gross side effects, risk and benefits, and alternatives of medications discussed with patient.  Patient is aware that all medications have potential side effects and we are unable to predict every side effect or drug-drug interaction that may occur.  Expresses verbal understanding and consents to current therapy plan and treatment regimen.  Return for tom am for FBW; then prn until est with new doc.  Please see AVS handed out to patient at the end of our visit for further patient instructions/ counseling done pertaining to today's office visit.    Note:  This document was prepared using Dragon voice recognition software and may include unintentional dictation errors.  This document serves as a record of services personally performed by [name], DO. It was created on her behalf by Peggye Fothergill, a trained medical scribe. The creation of this record is based on the scribe's personal observations and the provider's statements to them.  I have reviewed the above medical documentation for accuracy and completeness and I concur.  Thomasene Loteborah Cyndee Giammarco, DO 04/29/2018 8:36 PM    Subjective:    HPI: Neoma Lamingina R Hur is a 52 y.o. female  who [facility] for follow up of CHRONIC CONDITIONS.    Notes she's sometimes more depressed over the holidays because she misses her parents.  Overall, denies mood issues.  Weight Loss - BMI of 40.64, down from BMI of 52.72 Has lost 72 lbs.  Notes that now, during the holidays, she's at a standstill; hasn't lost any more weight, but hasn't gained.  She has been using Weight Watchers.  Is doing the app online, 23 points per day.  Was doing Weight Watchers with her friend from church, "a lady who is like a mom to me."  Was 253 lbs in August, now 222 lbs.  "I can move a lot better, I can walk a lot more."  She is working on exercise, trying to do more walking.  "I still have to stop sometimes because of my knees."  Tries to walk 20 minutes three days per week.  Does this after work and when she gets off.  Says she feels better.  Her friends keep telling her she's happier.  "I didn't realize how unhappy I was until everybody kept telling me how much happier I am."  Her main concern now is the "flubber" left behind from her weight loss.  HTN:  -  Her blood pressure has been controlled at home.  Pt has been checking it regularly.  - Patient reports good compliance with blood pressure medications  - Denies medication S-E   - Smoking Status noted   - She denies new onset of: chest pain, exercise intolerance, shortness of breath, dizziness, visual changes, headache, lower extremity swelling or claudication.   Last 3 blood pressure readings in our office are as follows: BP Readings from Last 3 Encounters:  04/26/18 112/73  12/04/17 107/72  06/26/17 140/77    Pulse Readings from Last 3 Encounters:  04/26/18 68  12/04/17 81  06/26/17 96    Filed Weights   04/26/18 1341  Weight: 222 lb 3.2 oz (100.8 kg)   [facility] 04/26/2018 12/04/2017 06/26/2017  Decreased Interest 3 1 1   Down, Depressed, Hopeless 1 1 1   PHQ - 2 Score 4  2 2   Altered sleeping 1 1 1   Tired, decreased energy 1 1 1   Change in appetite 1 1 1   Feeling bad or failure about yourself  1 1 0  Trouble concentrating 1 1 0  Moving slowly or fidgety/restless 0 1 0  Suicidal thoughts 0 0 0  PHQ-9 Score 9 8 5   Difficult doing work/chores - Somewhat difficult -    Patient Care Team    Relationship Specialty Notifications Start End  Thomasene Lotpalski, Marlan Steward, DO PCP - General Family Medicine  11/23/16   Salvatore MarvelWainer, Robert, MD Consulting Physician Orthopedic Surgery  11/23/16   Erick ColaceKirsteins, Andrew E, MD Consulting Physician Physical Medicine and Rehabilitation  11/23/16   Marcelle OverlieGrewal, Michelle, MD Consulting Physician Obstetrics and Gynecology  11/23/16      Lab Results  Component Value Date   CREATININE 0.72 12/04/2017   BUN 11 12/04/2017   NA 140 12/04/2017   K 3.6 12/04/2017   CL 102 12/04/2017   CO2 22 12/04/2017    Lab Results  Component Value Date   CHOL 206 (H) 04/27/2018  CHOL 185 12/04/2017   CHOL 203 (H) 12/12/2016    Lab Results  Component Value Date   HDL 39 (L) 04/27/2018   HDL 37 (L) 12/04/2017   HDL 41 12/12/2016    Lab Results  Component Value Date   LDLCALC 140 (H) 04/27/2018   LDLCALC 121 (H) 12/04/2017   LDLCALC 127 (H) 12/12/2016    Lab Results  Component Value Date   TRIG 134 04/27/2018   TRIG 134 12/04/2017   TRIG 175 (H) 12/12/2016    Lab Results  Component Value Date   CHOLHDL 5.3 (H) 04/27/2018   CHOLHDL 5.0 (H) 12/04/2017   CHOLHDL 5.0 (H) 12/12/2016    No results found for: LDLDIRECT ===================================================================   Patient Active Problem List   Diagnosis Date Noted  . HLD (hyperlipidemia) 12/19/2016    Priority: High  . Hypertriglyceridemia 12/19/2016    Priority: High  . Hypertension 11/23/2016    Priority: High  . Morbid obesity (HCC) 11/12/2015    Priority: High  . OA (osteoarthritis)- b/l knees, some back 08/13/2015    Priority: Medium  . Chronic pain  syndrome 11/12/2015    Priority: Low  . Weight loss counseling 04/26/2018  . Gastroesophageal reflux disease 12/04/2017  . chronic Right foot pain 12/04/2017  . Vitamin D deficiency 01/30/2017  . Chronic pain of right knee 01/09/2017  . Patellofemoral pain syndrome of right knee 01/09/2017  . Heart murmur 11/23/2016  . Abnormality of gait 11/12/2015     Past Medical History:  Diagnosis Date  . Arthritis   . Heart murmur   . Hypertension   . OA (osteoarthritis) 08/13/2015     Past Surgical History:  Procedure Laterality Date  . CHOLECYSTECTOMY  2005  . DILATION AND CURETTAGE OF UTERUS  2010     Family History  Problem Relation Age of Onset  . Hypertension Mother   . Diabetes Mother   . COPD Mother   . Heart disease Mother   . Diabetes Father   . Stroke Father   . Heart disease Father   . Heart attack Sister   . Lupus Sister      Social History   Substance and Sexual Activity  Drug Use No  ,  Social History   Substance and Sexual Activity  Alcohol Use No  . Alcohol/week: 0.0 standard drinks  ,  Social History   Tobacco Use  Smoking Status Never Smoker  Smokeless Tobacco Never Used  ,    Current Outpatient Medications on File Prior to Visit  Medication Sig Dispense Refill  . Cholecalciferol (VITAMIN D3) 5000 units TABS 5,000 IU OTC vitamin D3 daily. 90 tablet 3  . hydrochlorothiazide (HYDRODIURIL) 12.5 MG tablet Take 0.5 tablets (6.25 mg total) by mouth daily. 90 tablet 1  . losartan (COZAAR) 100 MG tablet Take 0.5 tablets (50 mg total) by mouth daily. 90 tablet 1  . MICROGESTIN 1-20 MG-MCG tablet Take 1 tablet by mouth daily.  2  . omeprazole (PRILOSEC) 20 MG capsule Take 20 mg by mouth daily.    . Vitamin D, Ergocalciferol, (DRISDOL) 50000 units CAPS capsule Take 1 capsule (50,000 Units total) by mouth every 7 (seven) days. 12 capsule 10   No current facility-administered medications on file prior to visit.      Allergies  Allergen Reactions    . Codeine Nausea Only  . Penicillins Other (See Comments)     Review of Systems:   General:  Denies fever, chills Optho/Auditory:   Denies visual  changes, blurred vision Respiratory:   Denies SOB, cough, wheeze, DIB  Cardiovascular:   Denies chest pain, palpitations, painful respirations Gastrointestinal:   Denies nausea, vomiting, diarrhea.  Endocrine:     Denies new hot or cold intolerance Musculoskeletal:  Denies joint swelling, gait issues, or new unexplained myalgias/ arthralgias Skin:  Denies rash, suspicious lesions  Neurological:    Denies dizziness, unexplained weakness, numbness  Psychiatric/Behavioral:   Denies mood changes  Objective:    Blood pressure 112/73, pulse 68, temperature 98.4 F (36.9 C), height 5\' 2"  (1.575 m), weight 222 lb 3.2 oz (100.8 kg), SpO2 100 %.  Body mass index is 40.64 kg/m.  General: Well Developed, well nourished, and in no acute distress.  HEENT: Normocephalic, atraumatic, pupils equal round reactive to light, neck supple, No carotid bruits, no JVD Skin: Warm and dry, cap RF less 2 sec Cardiac: Regular rate and rhythm, S1, S2 WNL's, no murmurs rubs or gallops Respiratory: ECTA B/L, Not using accessory muscles, speaking in full sentences. NeuroM-Sk: Ambulates w/o assistance, moves ext * 4 w/o difficulty, sensation grossly intact.  Ext: scant edema b/l lower ext Psych: No HI/SI, judgement and insight good, Euthymic mood. Full Affect.

## 2018-04-26 NOTE — Patient Instructions (Signed)
Congratulations on your's stupendous rise to healthier lifestyle!!!  Keep up the great work

## 2018-04-27 ENCOUNTER — Other Ambulatory Visit: Payer: BLUE CROSS/BLUE SHIELD

## 2018-04-27 DIAGNOSIS — E559 Vitamin D deficiency, unspecified: Secondary | ICD-10-CM

## 2018-04-27 DIAGNOSIS — E781 Pure hyperglyceridemia: Secondary | ICD-10-CM

## 2018-04-27 DIAGNOSIS — E785 Hyperlipidemia, unspecified: Secondary | ICD-10-CM

## 2018-04-28 LAB — LIPID PANEL
CHOL/HDL RATIO: 5.3 ratio — AB (ref 0.0–4.4)
Cholesterol, Total: 206 mg/dL — ABNORMAL HIGH (ref 100–199)
HDL: 39 mg/dL — ABNORMAL LOW (ref >39)
LDL CALC: 140 mg/dL — AB (ref 0–99)
Triglycerides: 134 mg/dL (ref 0–149)
VLDL CHOLESTEROL CAL: 27 mg/dL (ref 5–40)

## 2018-04-28 LAB — VITAMIN D 25 HYDROXY (VIT D DEFICIENCY, FRACTURES): VIT D 25 HYDROXY: 29.6 ng/mL — AB (ref 30.0–100.0)

## 2019-05-21 IMAGING — MG DIGITAL DIAGNOSTIC UNILATERAL RIGHT MAMMOGRAM WITH TOMO AND CAD
4 series · 4 of 12 positions shown · non-contrast
Comparison: Previous exam(s).

CLINICAL DATA: Screening recall for a right breast asymmetry.

EXAM:
DIGITAL DIAGNOSTIC UNILATERAL RIGHT MAMMOGRAM WITH CAD AND TOMO
RIGHT BREAST ULTRASOUND

[R CC synth-2D]
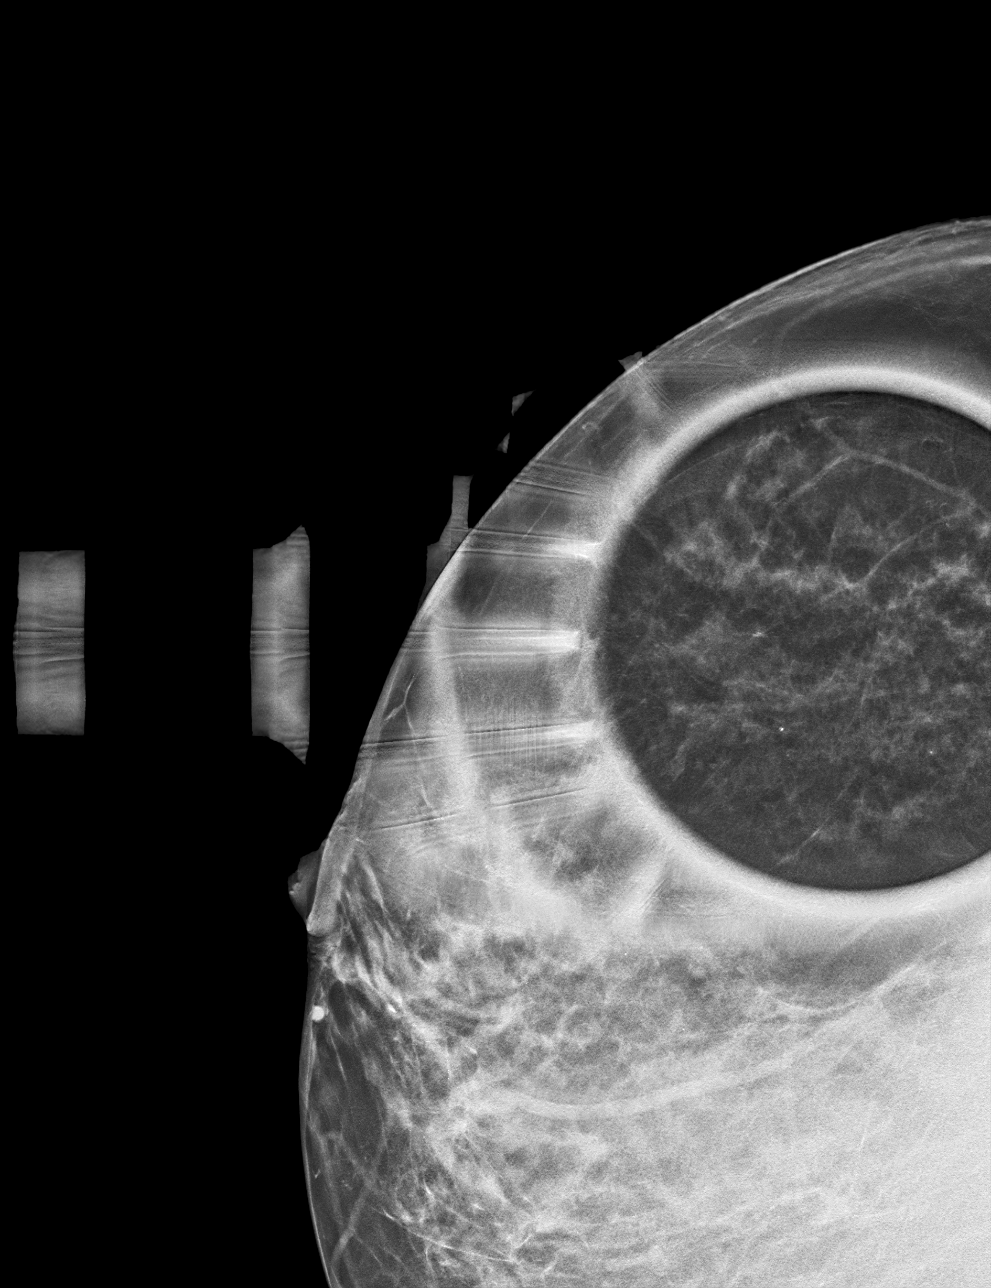

[R MLO synth-2D]
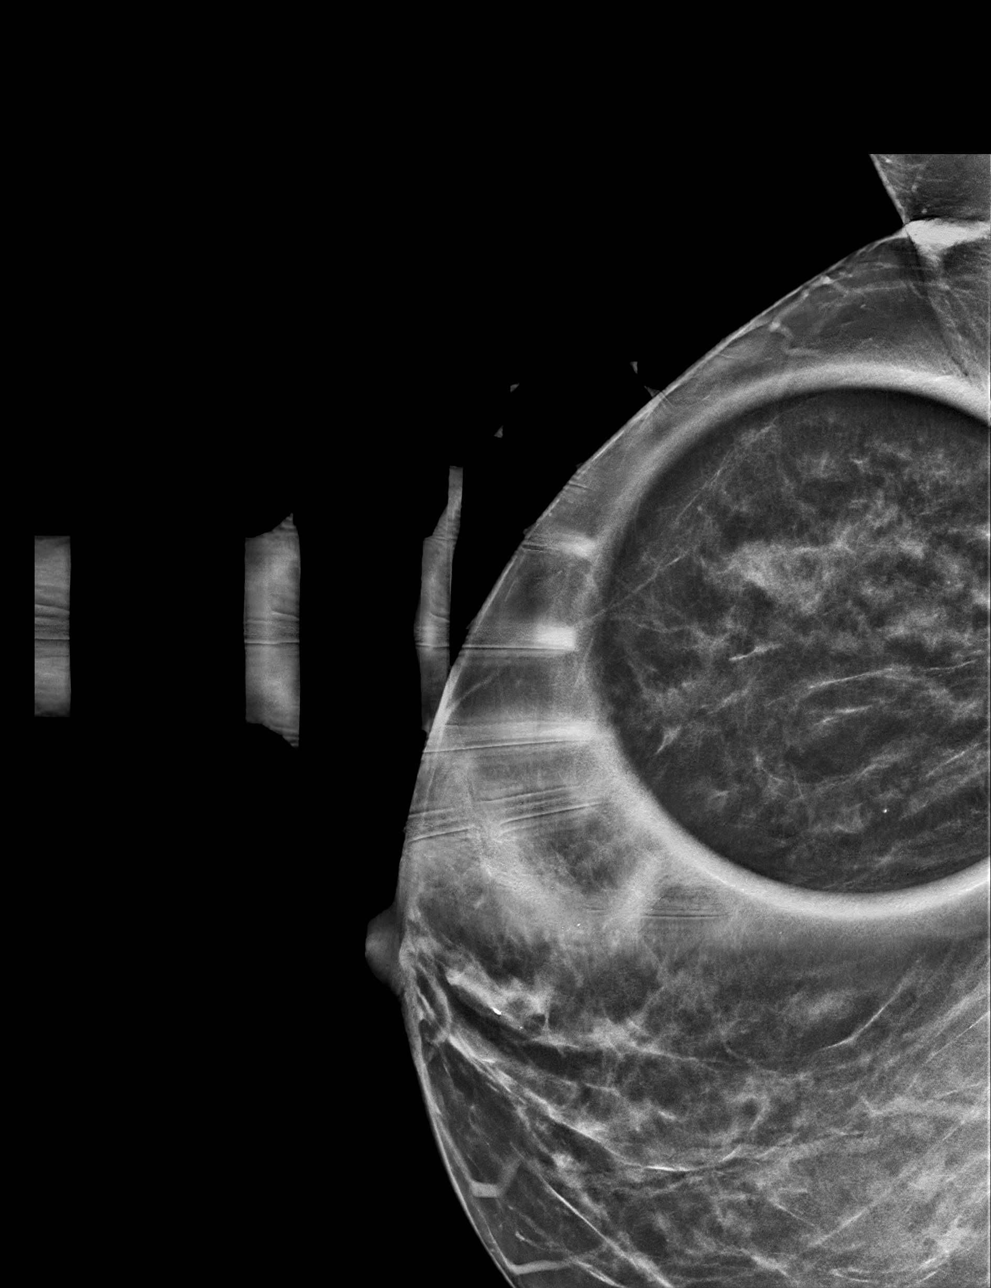

[R CC tomo · tomo slice 26/51.0]
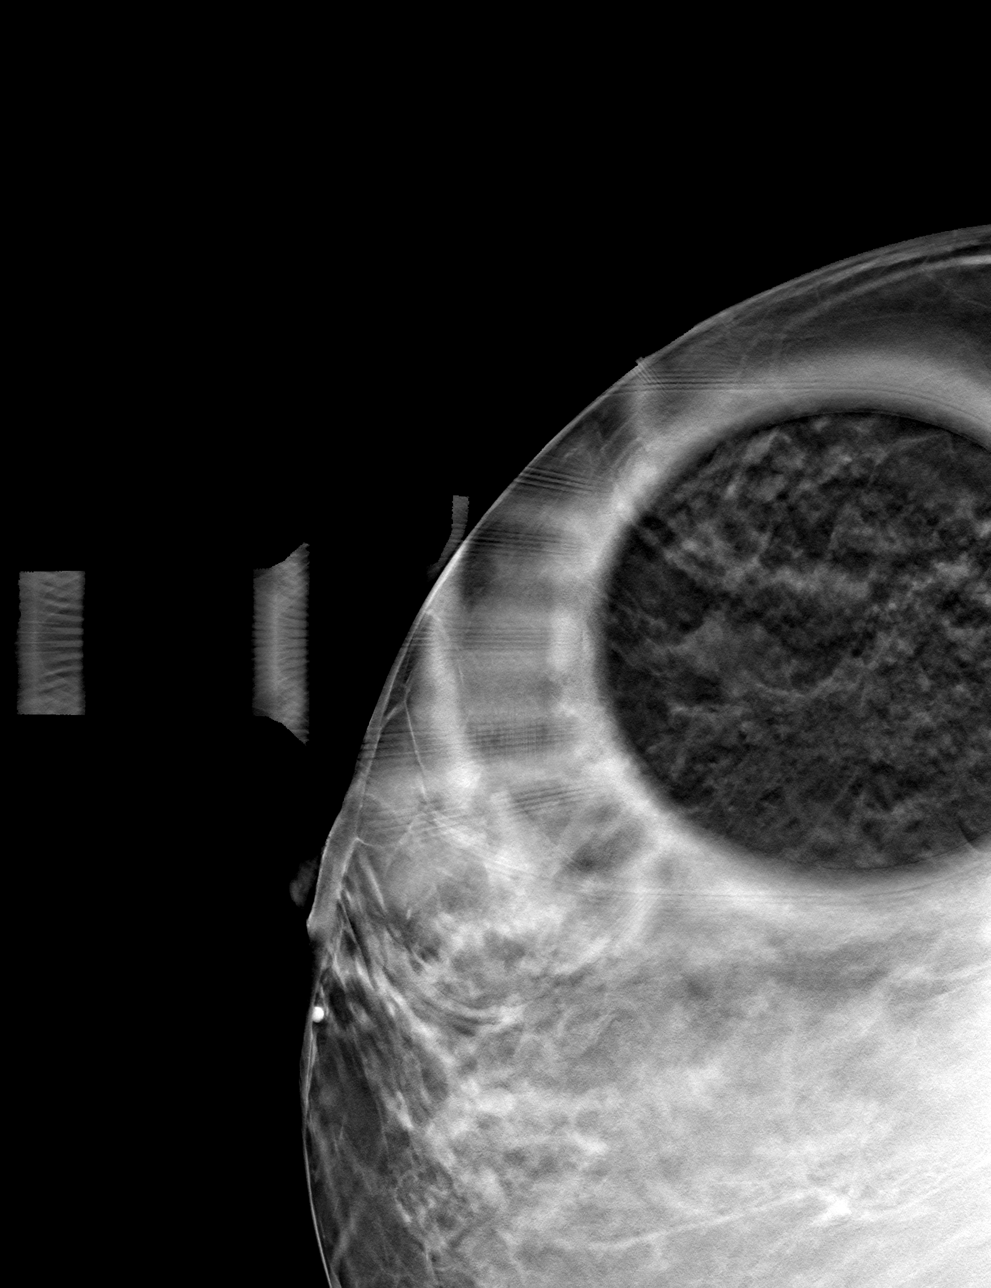

[R MLO tomo · tomo slice 30/59.0]
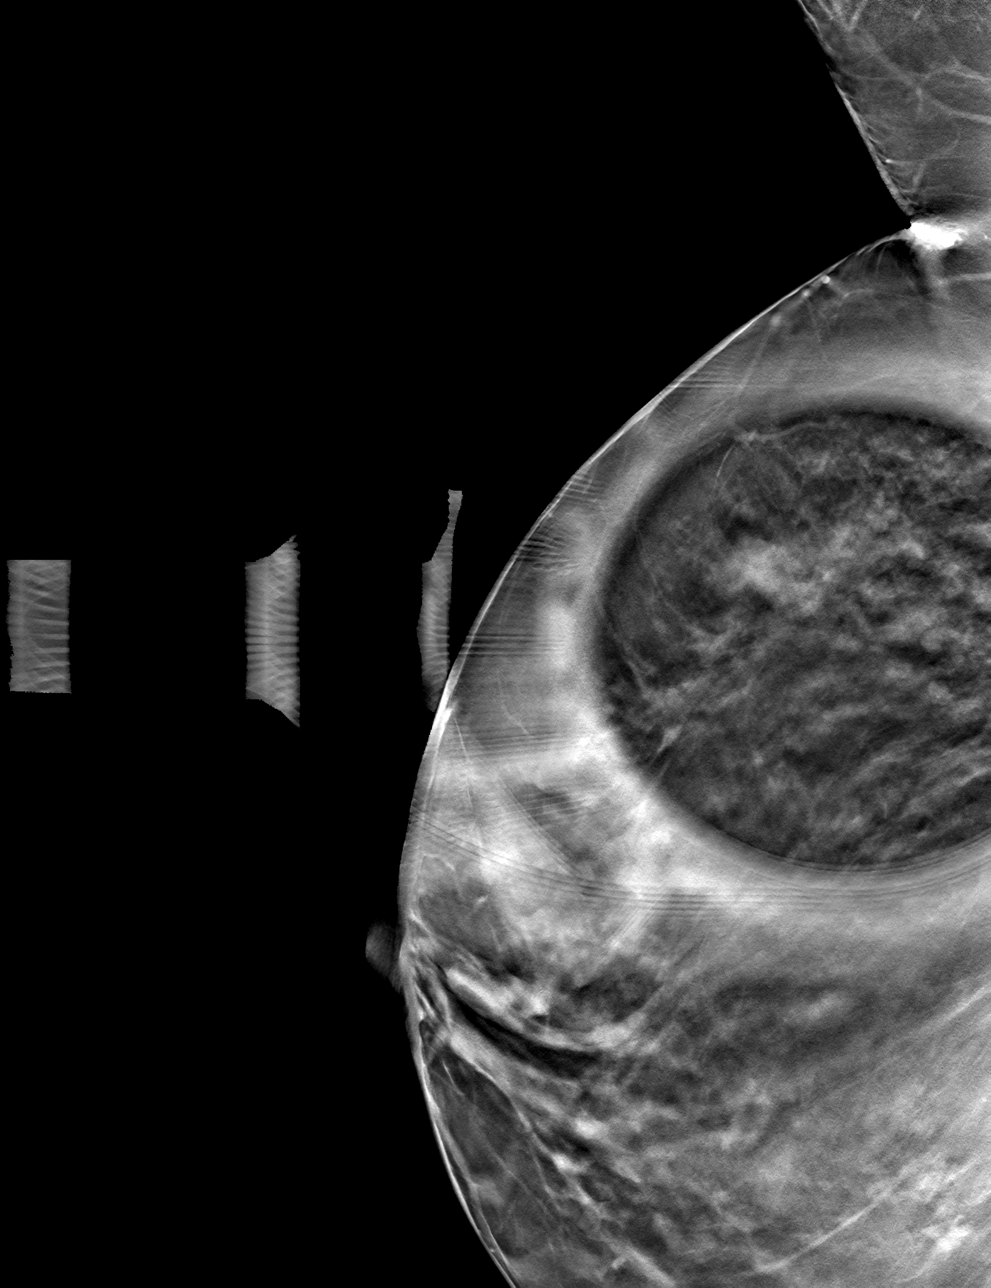

[4 of 12 positions shown; findings below may reference images not displayed]

ACR Breast Density Category b: There are scattered areas of
fibroglandular density.
FINDINGS: There is an asymmetry in the upper-outer quadrant of the right
breast, however as compared to the prior mammograms I believe that
this is not significantly changed, but only has a different
appearance due to the differences in positioning of the breast.
However, due to the density of the surrounding breast tissue,
ultrasound will be performed for further evaluation.

Mammographic images were processed with CAD.

Ultrasound of the upper-outer quadrant of the right breast
demonstrates normal fibroglandular tissue. No masses or suspicious
areas of shadowing are identified.
IMPRESSION: There are no persistent mammographic or targeted sonographic
abnormalities in the upper-outer quadrant of right breast.

RECOMMENDATION:
Screening mammogram in one year.(Code:DZ-R-EBK)

I have discussed the findings and recommendations with the patient.
Results were also provided in writing at the conclusion of the
visit. If applicable, a reminder letter will be sent to the patient
regarding the next appointment.

BI-RADS CATEGORY  1: Negative.

## 2019-08-06 IMAGING — DX DG FOOT COMPLETE 3+V*R*
3 series · 3 of 3 positions shown · non-contrast
Comparison: None.

CLINICAL DATA: Acute RIGHT foot pain. No known injury. Initial
encounter.

EXAM:
RIGHT FOOT COMPLETE - 3+ VIEW

[foot dp]
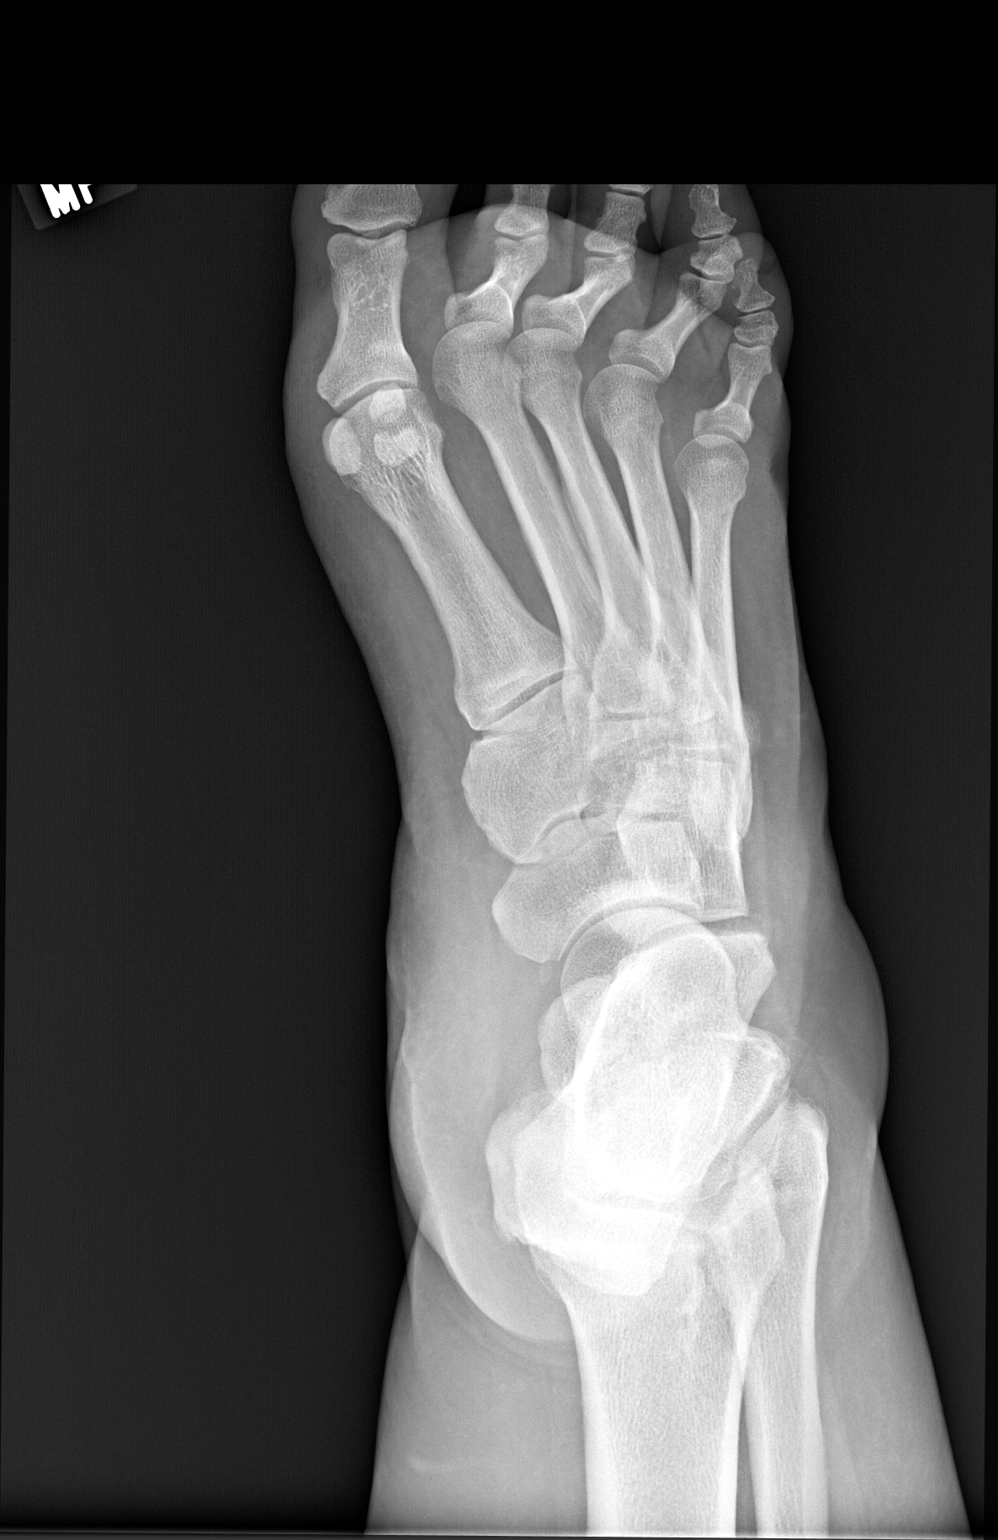

[foot oblique]
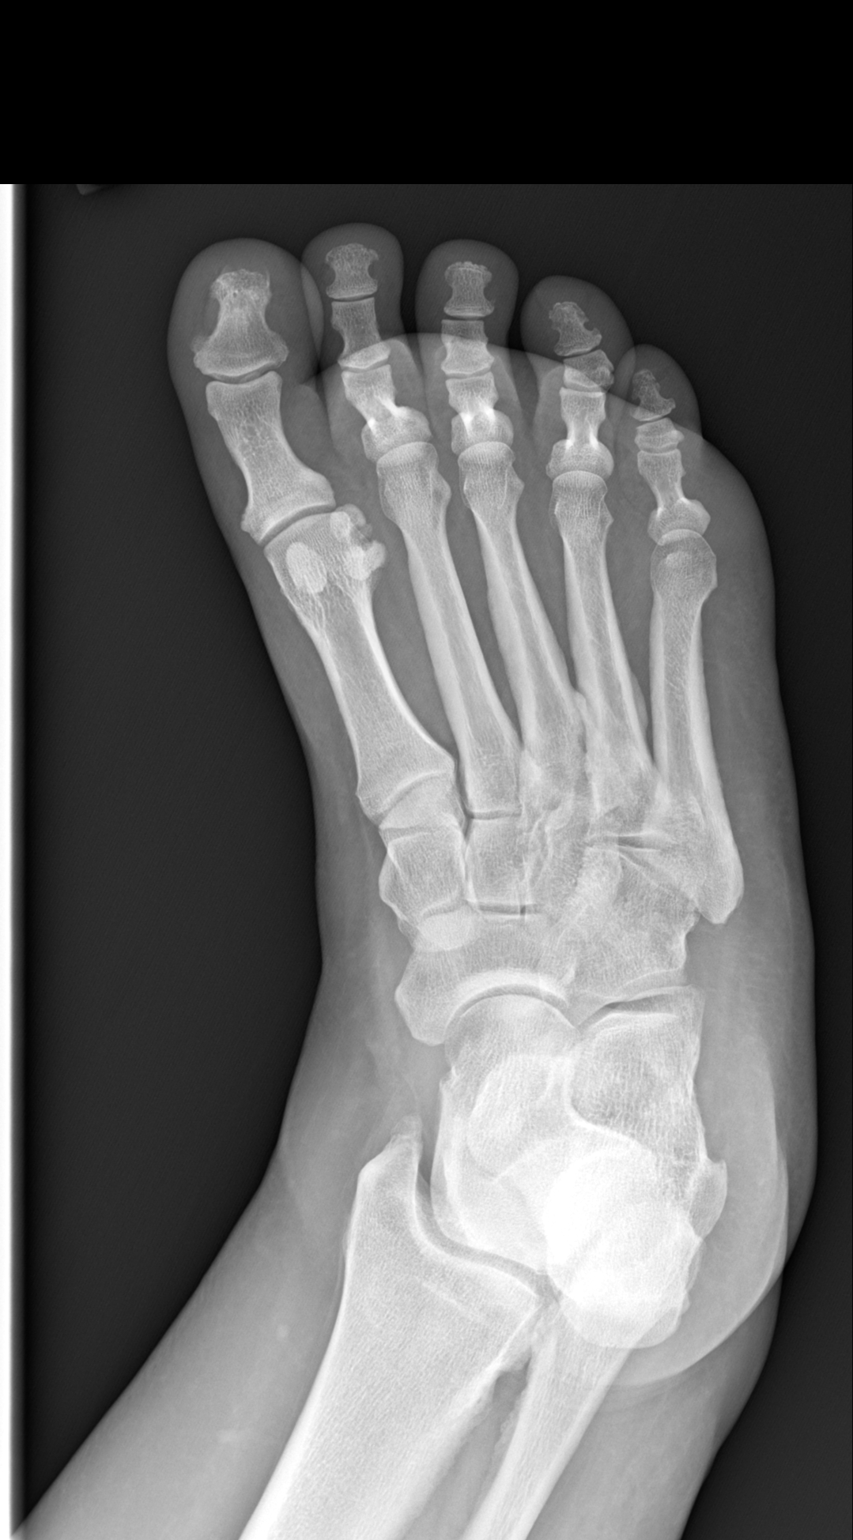

[foot lat]
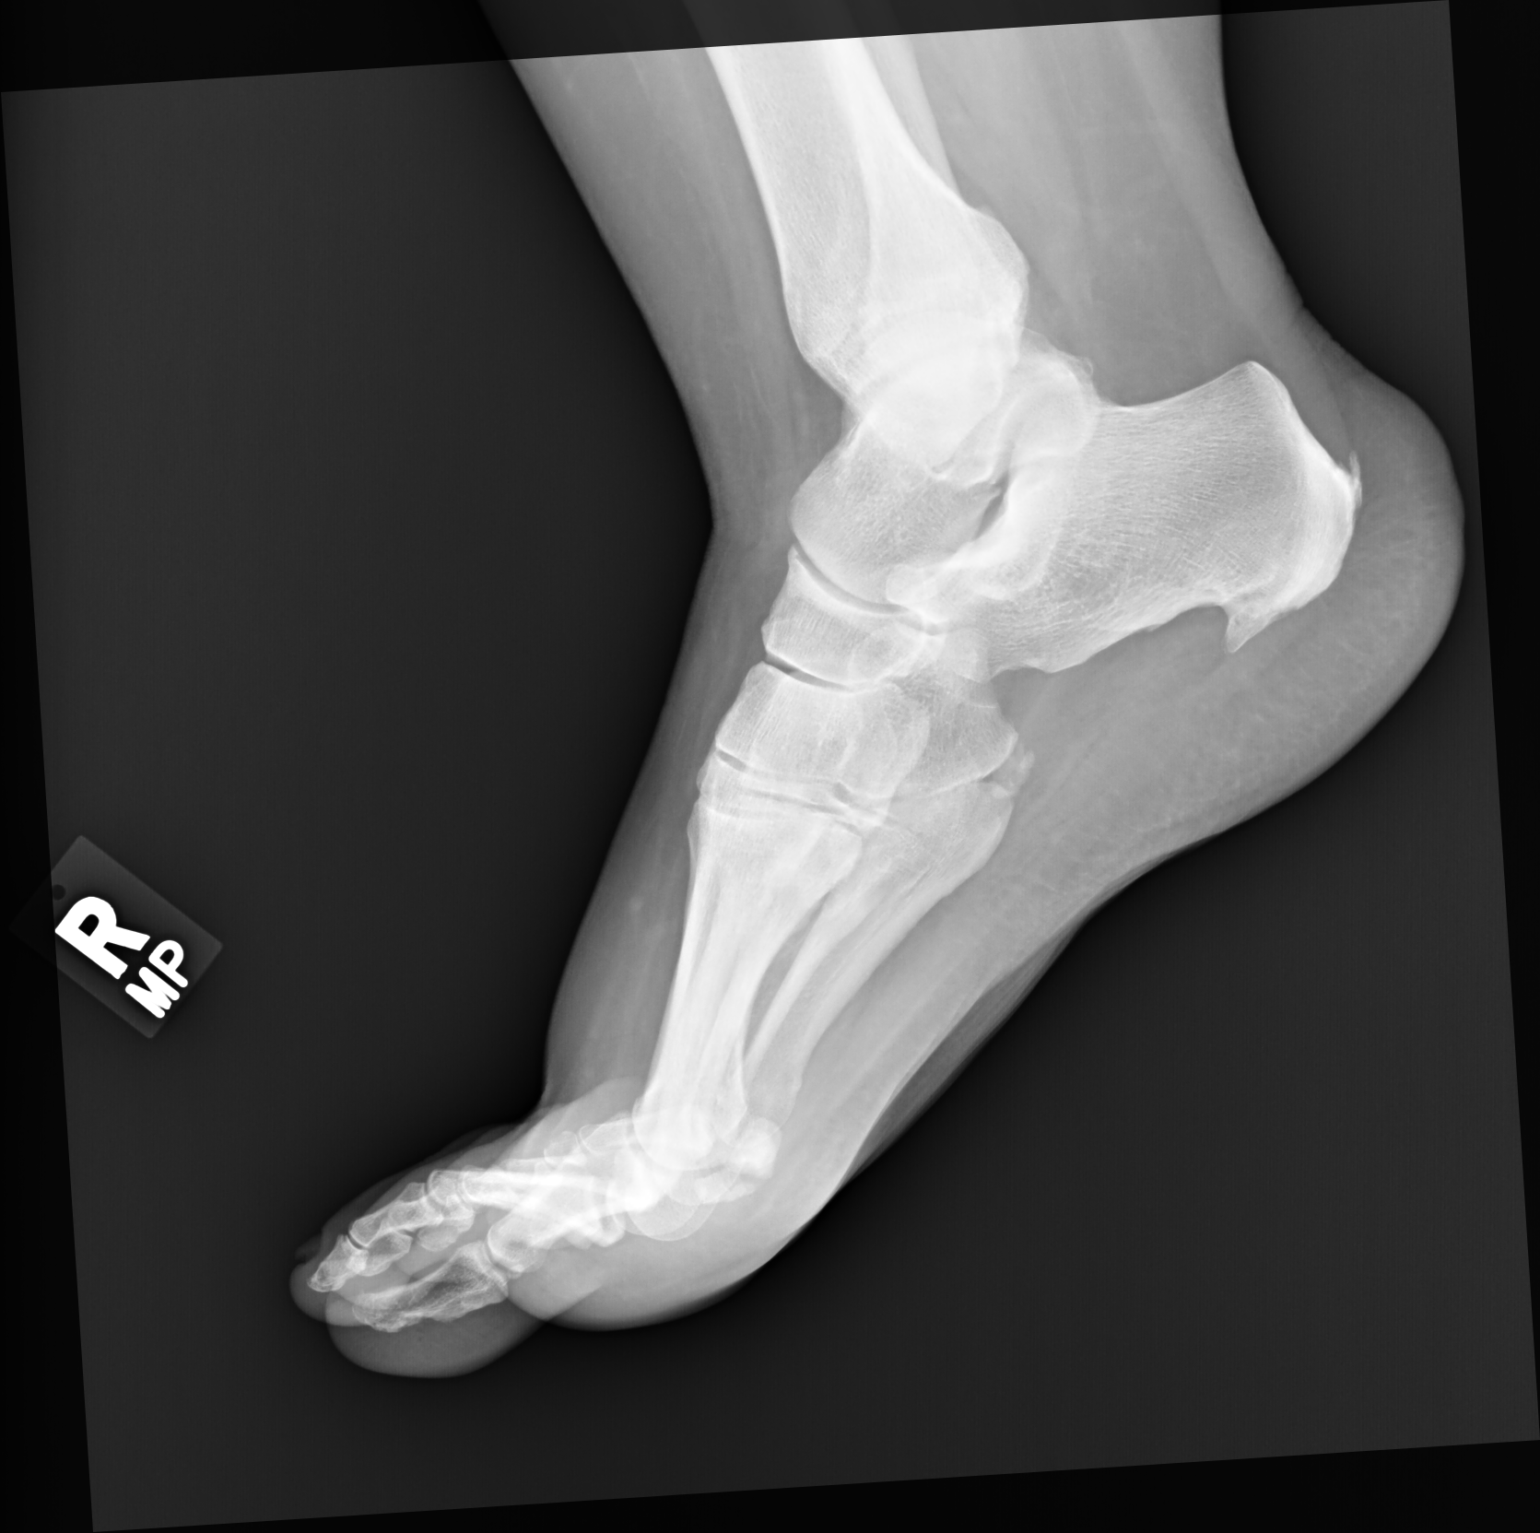

[3 of 3 positions shown; findings below may reference images not displayed]

FINDINGS: No acute fracture, subluxation or dislocation.

No arthropathy identified.

The Lisfranc joints are unremarkable.

Question dorsal soft tissue swelling.

A small to moderate calcaneal spur is present.
IMPRESSION: Question dorsal soft tissue swelling.  No acute bony abnormality.

Calcaneal spur.

## 2021-04-01 DEATH — deceased
